# Patient Record
Sex: Male | Born: 1955 | Race: Black or African American | Hispanic: No | Marital: Single | Smoking: Former smoker
Health system: Southern US, Community
[De-identification: ages and names within clinical notes are randomized; demographics above are authoritative.]

## PROBLEM LIST (undated history)

## (undated) DIAGNOSIS — R030 Elevated blood-pressure reading, without diagnosis of hypertension: Secondary | ICD-10-CM

## (undated) DIAGNOSIS — T7840XA Allergy, unspecified, initial encounter: Secondary | ICD-10-CM

## (undated) DIAGNOSIS — E785 Hyperlipidemia, unspecified: Secondary | ICD-10-CM

## (undated) DIAGNOSIS — K219 Gastro-esophageal reflux disease without esophagitis: Secondary | ICD-10-CM

## (undated) HISTORY — DX: Elevated blood-pressure reading, without diagnosis of hypertension: R03.0

## (undated) HISTORY — DX: Gastro-esophageal reflux disease without esophagitis: K21.9

## (undated) HISTORY — PX: OTHER SURGICAL HISTORY: SHX169

## (undated) HISTORY — DX: Allergy, unspecified, initial encounter: T78.40XA

## (undated) HISTORY — PX: WISDOM TOOTH EXTRACTION: SHX21

## (undated) HISTORY — DX: Hyperlipidemia, unspecified: E78.5

## (undated) HISTORY — PX: MOUTH SURGERY: SHX715

---

## 2012-01-14 ENCOUNTER — Encounter (HOSPITAL_COMMUNITY): Payer: Self-pay

## 2012-01-14 ENCOUNTER — Emergency Department (HOSPITAL_COMMUNITY)
Admission: EM | Admit: 2012-01-14 | Discharge: 2012-01-14 | Disposition: A | Discharge status: Home / Self Care | Payer: Federal, State, Local not specified - PPO | Attending: Emergency Medicine | Admitting: Emergency Medicine

## 2012-01-14 DIAGNOSIS — F17200 Nicotine dependence, unspecified, uncomplicated: Secondary | ICD-10-CM | POA: Insufficient documentation

## 2012-01-14 DIAGNOSIS — H6090 Unspecified otitis externa, unspecified ear: Secondary | ICD-10-CM

## 2012-01-14 DIAGNOSIS — H9209 Otalgia, unspecified ear: Secondary | ICD-10-CM | POA: Insufficient documentation

## 2012-01-14 MED ORDER — NEOMYCIN-POLYMYXIN-HC 3.5-10000-1 OT SUSP: 4.0000 [drp] | Freq: Four times a day (QID) | OTIC | Status: AC

## 2012-01-14 NOTE — Discharge Instructions (Signed)
Otitis Externa  Otitis externa ("swimmer's ear") is a germ (bacterial) or fungal infection of the outer ear canal (from the eardrum to the outside of the ear). Swimming in dirty water may cause swimmer's ear. It also may be caused by moisture in the ear from water remaining after swimming or bathing. Often the first signs of infection may be itching in the ear canal. This may progress to ear canal swelling, redness, and pus drainage, which may be signs of infection.  HOME CARE INSTRUCTIONS    Apply the antibiotic drops to the ear canal as prescribed by your doctor.   This can be a very painful medical condition. A strong pain reliever may be prescribed.   Only take over-the-counter or prescription medicines for pain, discomfort, or fever as directed by your caregiver.   If your caregiver has given you a follow-up appointment, it is very important to keep that appointment. Not keeping the appointment could result in a chronic or permanent injury, pain, hearing loss and disability. If there is any problem keeping the appointment, you must call back to this facility for assistance.  PREVENTION    It is important to keep your ear dry. Use the corner of a towel to wick water out of the ear canal after swimming or bathing.   Avoid scratching in your ear. This can damage the ear canal or remove the protective wax lining the canal and make it easier for germs (bacteria) or a fungus to grow.   You may use ear drops made of rubbing alcohol and vinegar after swimming to prevent future "swimmer's ear" infections. Make up a small bottle of equal parts white vinegar and alcohol. Put 3 or 4 drops into each ear after swimming.   Avoid swimming in lakes, polluted water, or poorly chlorinated pools.  SEEK MEDICAL CARE IF:    An oral temperature above 102 F (38.9 C) develops.   Your ear is still painful after 3 days and shows signs of getting worse (redness, swelling, pain, or pus).  MAKE SURE YOU:    Understand these  instructions.   Will watch your condition.   Will get help right away if you are not doing well or get worse.  Document Released: 09/30/2005 Document Revised: 09/19/2011 Document Reviewed: 05/06/2008  [employer] Patient Information 2012 [employer].

## 2012-01-14 NOTE — ED Notes (Addendum)
LT ear pain x  1 week.  States there has been a little bit of brown drainage w/a spot of blood.  States it is hard to hear out of that ear.

## 2012-01-14 NOTE — ED Provider Notes (Signed)
History     CSN: [id number]  Arrival date & time 01/14/12  1854   First MD Initiated Contact with Patient 01/14/12 1921      No chief complaint on file.   (Consider location/radiation/quality/duration/timing/severity/associated sxs/prior treatment) HPI Comments: Patient reports that he began having left ear pain two days ago.  Prior to this he was concerned about wax in his ear and had been using OTC cerumen removal ear drops and had been flushing his ear with water to remove the cerumen.    Patient is a 56 y.o. male presenting with ear pain. The history is provided by the patient.  Otalgia This is a new problem. There is pain in the left ear. The problem occurs constantly. The problem has been gradually worsening. There has been no fever. The pain is moderate. Pertinent negatives include no ear discharge, no headaches, no hearing loss, no rhinorrhea, no sore throat, no vomiting, no neck pain and no rash.    History reviewed. No pertinent past medical history.  History reviewed. No pertinent past surgical history.  History reviewed. No pertinent family history.  History  Substance Use Topics  . Smoking status: Current Some Day Smoker -- 0.0 packs/day    Types: Pipe, Cigars  . Smokeless tobacco: Not on file  . Alcohol Use: Yes     occasionally      Review of Systems  Constitutional: Negative for fever and chills.  HENT: Positive for ear pain. Negative for hearing loss, congestion, sore throat, rhinorrhea, neck pain, sinus pressure, tinnitus and ear discharge.   Gastrointestinal: Negative for vomiting.  Skin: Negative for color change and rash.  Neurological: Negative for dizziness, light-headedness and headaches.    Allergies  Penicillins  Home Medications   Current Outpatient Rx  Name Route Sig Dispense Refill  . MENS MULTI VITAMIN & MINERAL PO Oral Take 1 tablet by mouth daily.    . NYQUIL PO Oral Take 1 capsule by mouth daily as needed. Flu like symptoms       BP 112/64  Pulse 79  Temp(Src) 98.3 F (36.8 C) (Oral)  Resp 18  SpO2 100%  Physical Exam  Nursing note and vitals reviewed. Constitutional: He appears well-developed and well-nourished.  HENT:  Head: Normocephalic.  Right Ear: Hearing, tympanic membrane, external ear and ear canal normal.  Left Ear: Hearing, tympanic membrane and external ear normal.  Nose: Nose normal. Right sinus exhibits no maxillary sinus tenderness and no frontal sinus tenderness. Left sinus exhibits no maxillary sinus tenderness and no frontal sinus tenderness.  Mouth/Throat: Uvula is midline, oropharynx is clear and moist and mucous membranes are normal.       Left EAC edema and erythema   Neck: Normal range of motion. Neck supple.  Cardiovascular: Normal rate, regular rhythm and normal heart sounds.   Pulmonary/Chest: Effort normal and breath sounds normal.  Neurological: He is alert.  Skin: Skin is warm and dry. No erythema.  Psychiatric: He has a normal mood and affect.    ED Course  Procedures (including critical care time)  Labs Reviewed - No data to display No results found.   No diagnosis found.    MDM  Patient with ear pain x 2 days.  No loss of hearing.  PE consistent with OE.  Will treat patient Polymyxin B/Neomycin/Hydrocortisone drops.        Pascal Lux [place], PA-C 01/15/12 1743

## 2012-01-15 NOTE — ED Provider Notes (Signed)
Medical screening examination/treatment/procedure(s) were performed by non-physician practitioner and as supervising physician I was immediately available for consultation/collaboration.   Jani Moronta, MD 01/15/12 2101 

## 2016-09-02 DIAGNOSIS — K080 Exfoliation of teeth due to systemic causes: Secondary | ICD-10-CM | POA: Diagnosis not present

## 2016-09-20 DIAGNOSIS — H04123 Dry eye syndrome of bilateral lacrimal glands: Secondary | ICD-10-CM | POA: Diagnosis not present

## 2016-09-20 DIAGNOSIS — H2513 Age-related nuclear cataract, bilateral: Secondary | ICD-10-CM | POA: Diagnosis not present

## 2017-01-07 ENCOUNTER — Encounter: Payer: Self-pay | Admitting: Family Medicine

## 2017-01-07 ENCOUNTER — Ambulatory Visit (INDEPENDENT_AMBULATORY_CARE_PROVIDER_SITE_OTHER): Payer: Federal, State, Local not specified - PPO | Admitting: Family Medicine

## 2017-01-07 VITALS — BP 118/88 | HR 86 | Temp 98.5°F | Ht 69.0 in | Wt 191.2 lb

## 2017-01-07 DIAGNOSIS — Z87891 Personal history of nicotine dependence: Secondary | ICD-10-CM | POA: Diagnosis not present

## 2017-01-07 DIAGNOSIS — N400 Enlarged prostate without lower urinary tract symptoms: Secondary | ICD-10-CM | POA: Insufficient documentation

## 2017-01-07 DIAGNOSIS — E785 Hyperlipidemia, unspecified: Secondary | ICD-10-CM

## 2017-01-07 DIAGNOSIS — N3943 Post-void dribbling: Secondary | ICD-10-CM

## 2017-01-07 DIAGNOSIS — Z789 Other specified health status: Secondary | ICD-10-CM | POA: Diagnosis not present

## 2017-01-07 DIAGNOSIS — R030 Elevated blood-pressure reading, without diagnosis of hypertension: Secondary | ICD-10-CM

## 2017-01-07 DIAGNOSIS — Z0000 Encounter for general adult medical examination without abnormal findings: Secondary | ICD-10-CM

## 2017-01-07 DIAGNOSIS — N401 Enlarged prostate with lower urinary tract symptoms: Secondary | ICD-10-CM

## 2017-01-07 DIAGNOSIS — Z125 Encounter for screening for malignant neoplasm of prostate: Secondary | ICD-10-CM

## 2017-01-07 LAB — LIPID PANEL
CHOL/HDL RATIO: 5
Cholesterol: 222 mg/dL — ABNORMAL HIGH (ref 0–200)
HDL: 42.2 mg/dL (ref >39.00)
LDL Cholesterol: 160 mg/dL — ABNORMAL HIGH (ref 0–99)
NONHDL: 179.87
Triglycerides: 99 mg/dL (ref 0.0–149.0)
VLDL: 19.8 mg/dL (ref 0.0–40.0)

## 2017-01-07 LAB — COMPREHENSIVE METABOLIC PANEL
ALK PHOS: 85 U/L (ref 39–117)
ALT: 21 U/L (ref 0–53)
AST: 19 U/L (ref 0–37)
Albumin: 5 g/dL (ref 3.5–5.2)
BUN: 14 mg/dL (ref 6–23)
CO2: 31 meq/L (ref 19–32)
Calcium: 9.8 mg/dL (ref 8.4–10.5)
Chloride: 99 mEq/L (ref 96–112)
Creatinine, Ser: 1.01 mg/dL (ref 0.40–1.50)
GFR: 97.36 mL/min (ref >60.00)
GLUCOSE: 91 mg/dL (ref 70–99)
Potassium: 3.9 mEq/L (ref 3.5–5.1)
SODIUM: 136 meq/L (ref 135–145)
TOTAL PROTEIN: 7.4 g/dL (ref 6.0–8.3)
Total Bilirubin: 1 mg/dL (ref 0.2–1.2)

## 2017-01-07 LAB — POC URINALSYSI DIPSTICK (AUTOMATED)
BILIRUBIN UA: NEGATIVE
GLUCOSE UA: NEGATIVE
KETONES UA: NEGATIVE
Leukocytes, UA: NEGATIVE
NITRITE UA: NEGATIVE
Protein, UA: NEGATIVE
RBC UA: NEGATIVE
SPEC GRAV UA: 1.03 (ref 1.030–1.035)
Urobilinogen, UA: 0.2 (ref ?–2.0)
pH, UA: 6 (ref 5.0–8.0)

## 2017-01-07 LAB — CBC
HCT: 45.4 % (ref 39.0–52.0)
Hemoglobin: 15.2 g/dL (ref 13.0–17.0)
MCHC: 33.5 g/dL (ref 30.0–36.0)
MCV: 92 fl (ref 78.0–100.0)
Platelets: 262 10*3/uL (ref 150.0–400.0)
RBC: 4.94 Mil/uL (ref 4.22–5.81)
RDW: 13.6 % (ref 11.5–15.5)
WBC: 3.7 10*3/uL — AB (ref 4.0–10.5)

## 2017-01-07 LAB — PSA: PSA: 1.56 ng/mL (ref 0.10–4.00)

## 2017-01-07 NOTE — Patient Instructions (Addendum)
Sign release of information at the check out desk for records from Dr. Nena Jordan including office visits, labs, imaging, immunizations for last 3 years at least. Need colonoscopy report as well  May want to keep an eye on blood pressure with goal <140/90  Prostate slightly large, will need to trend psa yearly  Please stop by lab before you go

## 2017-01-07 NOTE — Assessment & Plan Note (Signed)
Travels for [place]- may reach out for Malaria prophylaxis. He will let me know what he tolerated last trip and I will review

## 2017-01-07 NOTE — Assessment & Plan Note (Signed)
states up with stress. Initially elevated today and improved on repeat- will trend yearly

## 2017-01-07 NOTE — Assessment & Plan Note (Signed)
BPH in family members- dad, brother. He has enlarged prostate on exam and deals with dribbling at times.  Lab Results  Component Value Date   PSA 1.56 01/07/2017  we discussed trending psa and getting rectal exam yearly

## 2017-01-07 NOTE — Progress Notes (Signed)
Pre visit review using our clinic review tool, if applicable. No additional management support is needed unless otherwise documented below in the visit note. 

## 2017-01-07 NOTE — Assessment & Plan Note (Signed)
no rx. 7.1% 10 year ascvd risk. Repeat levels next year. Work on Oceanographer exercise

## 2017-01-07 NOTE — Progress Notes (Signed)
Phone: [phone]  Subjective:  Patient presents today to establish care.  Prior patient in [place] Dr. Nena Jordan just moved within last year. Chief complaint-noted.   See problem oriented charting  The following were reviewed and entered/updated in epic: Past Medical History:  Diagnosis Date  . Elevated blood pressure reading    states up with stress.   . Hyperlipidemia    no rx   Patient Active Problem List   Diagnosis Date Noted  . Hyperlipidemia     Priority: High  . BPH (benign prostatic hyperplasia) 01/07/2017    Priority: Medium  . Former smoker 01/07/2017    Priority: Low  . Hx of foreign travel 01/07/2017    Priority: Low  . Elevated blood pressure reading     Priority: Low   Past Surgical History:  Procedure Laterality Date  . none      Family History  Problem Relation Age of Onset  . Hypertension Mother   . Pneumonia Mother     lived to 4  . Hypertension Father   . Prostate cancer Father     lived to 75  . Asthma Sister     due to asthma  . Prostate cancer Brother     he is a gynecologist in [place]    Medications- reviewed and updated Current Outpatient Prescriptions  Medication Sig Dispense Refill  . Multiple Vitamins-Minerals (MENS MULTI VITAMIN & MINERAL PO) Take 1 tablet by mouth daily.     No current facility-administered medications for this visit.     Allergies-reviewed and updated Allergies  Allergen Reactions  . Penicillins     unknown    Social History   Social History  . Marital status: Single    Spouse name: N/A  . Number of children: N/A  . Years of education: N/A   Social History Main Topics  . Smoking status: Former Smoker    Packs/day: 0.40    Years: 20.00    Types: Pipe, Cigars    Quit date: 10/14/2008  . Smokeless tobacco: Never Used  . Alcohol use Yes     Comment: occasionally  . Drug use: No  . Sexual activity: Yes   Other Topics Concern  . None   Social History Narrative   Lives alone. Single. Dating  long distance. Has more than 1 partner but uses protection   From [place]. Has been to 75 countries.       Retired around 6. 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  HENT: Negative for hearing loss and tinnitus.   Eyes: Negative for blurred vision and double vision.  Respiratory: Negative for cough and hemoptysis.   Cardiovascular: Negative for chest pain and claudication.  Gastrointestinal: Negative for heartburn and nausea.  Genitourinary: Positive for frequency. Negative for urgency.  Musculoskeletal: Negative for myalgias and neck pain.  Skin: Negative for itching and rash.  Neurological: Negative for dizziness and headaches.  Endo/Heme/Allergies: Negative for polydipsia. Does not bruise/bleed easily.  Psychiatric/Behavioral: Negative for hallucinations and substance abuse.  does have low libido at times but no major issue with erections Palpitations in past but none recently  Objective: BP 118/88   Pulse 86  Temp 98.5 F (36.9 C) (Oral)   Ht 5\' 9"  (1.753 m)   Wt 191 lb 3.2 oz (86.7 kg)   SpO2 98%   BMI 28.24 kg/m  Gen: NAD, resting comfortably HEENT: Mucous membranes are moist. Oropharynx normal. TM normal. Eyes: sclera and lids normal, PERRLA Neck: no thyromegaly, no cervical lymphadenopathy CV: RRR no murmurs rubs or gallops Lungs: CTAB no crackles, wheeze, rhonchi Abdomen: soft/nontender/nondistended/normal bowel sounds. No rebound or guarding.  Ext: no edema Skin: warm, dry Neuro: 5/5 strength in upper and lower extremities, normal gait, normal reflexes Rectal: normal tone, mild diffusely enlarged prostate, no masses or tenderness  Assessment/Plan:  61  y.o. male presenting for annual physical.  Health Maintenance counseling: 1. Anticipatory guidance: Patient counseled regarding regular dental exams, eye exams, wearing seatbelts.  2. Risk factor reduction:  Advised patient of need for regular exercise and diet rich and fruits and vegetables to reduce risk of heart attack and stroke. Exercises at least 3x a week.  3. Immunizations/screenings/ancillary studies Immunization History  Administered Date(s) Administered  . Influenza-Unspecified 08/28/2016   Health Maintenance Due  Topic Date Due  . Hepatitis C Screening - states had this years ago thorugh work 1956-09-03  . HIV Screening - did within last year 05/18/1973  . TETANUS/TDAP - had when went to [place]. Will get records.  05/18/1977  . COLONOSCOPY - get records around 2013 or 2014 05/18/2008   4. Prostate cancer screening- BPH on exam and history. Will trend PSA Lab Results  Component Value Date   PSA 1.56 01/07/2017  5. Colon cancer screening - as noted above 6. STD screening- declines, despite having new partner in last year. Always uses protection   Hx of foreign travel [place] for [place]- may reach out for Malaria prophylaxis. He will let me know what he tolerated last trip and I will review  BPH (benign prostatic hyperplasia) BPH in family members- dad, brother. He has enlarged prostate on exam and deals with dribbling at times.  Lab Results  Component Value Date   PSA 1.56 01/07/2017  we discussed trending psa and getting rectal exam yearly  Hyperlipidemia no rx. 7.1% 10 year ascvd risk. Repeat levels next year. Work on healthy eating/regular exercise  Elevated blood pressure reading states up with stress. Initially elevated today and improved on repeat- will trend yearly   Return in about 1 year (around 01/07/2018) for physical.   Orders Placed This Encounter  Procedures  . CBC    [place]  . Comprehensive metabolic panel    [place]    Order Specific Question:    Has the patient fasted?    Answer:   No  . Lipid panel    [place]    Order Specific Question:   Has the patient fasted?    Answer:   No  . PSA  . POCT Urinalysis Dipstick (Automated)   Return precautions advised. Garret Reddish, MD

## 2017-01-08 ENCOUNTER — Encounter: Payer: Self-pay | Admitting: Family Medicine

## 2017-01-08 MED ORDER — MEFLOQUINE HCL 250 MG PO TABS: 250.0000 mg | ORAL_TABLET | ORAL | 0 refills | Status: DC

## 2017-01-16 ENCOUNTER — Encounter: Payer: Federal, State, Local not specified - PPO | Admitting: Family Medicine

## 2017-01-23 ENCOUNTER — Telehealth: Payer: Self-pay

## 2017-01-23 NOTE — Telephone Encounter (Signed)
I called and left a detailed voicemail message on his voicemail reading what the My Chart message stated. I also left a return phone number for any questions.

## 2017-01-23 NOTE — Telephone Encounter (Signed)
-----   Message from Marin Olp, MD sent at 01/22/2017  9:21 AM EDT ----- Can convert to phone call. Unread mychart message  "I sent in mefloquine for you. You should start it 1 week before you travel and continue 4 weeks after you return.   My recommendation for cholesterol would be first to increase exercise to 4-5 days a week. In addition, the [place] diet has been shown to be heart healthy diet and it would be reasonable to focus on a diet rich in vegetables and fruits.   Garret Reddish  "

## 2017-02-03 ENCOUNTER — Encounter: Payer: Self-pay | Admitting: Family Medicine

## 2017-02-03 ENCOUNTER — Telehealth: Payer: Self-pay | Admitting: Family Medicine

## 2017-02-03 MED ORDER — PREDNISONE 20 MG PO TABS: ORAL_TABLET | ORAL | 0 refills | Status: DC

## 2017-02-03 MED ORDER — FLUTICASONE PROPIONATE 50 MCG/ACT NA SUSP: 2.0000 | Freq: Every day | NASAL | 6 refills | Status: DC

## 2017-02-03 NOTE — Telephone Encounter (Signed)
Letter proposal.

## 2017-02-03 NOTE — Telephone Encounter (Signed)
See my chart message

## 2017-02-03 NOTE — Telephone Encounter (Signed)
Troy Christian pt is returning your call it is personal state "I really need to speak with Dr. Yong Channel on a urgent matter"

## 2017-02-03 NOTE — Telephone Encounter (Signed)
Dr. Hunter - Please advise. Thanks! 

## 2017-02-03 NOTE — Telephone Encounter (Signed)
Called and spoke with patient this morning who states that his allergies have flared and he is having a hard time getting them under control. He states he sent a letter via My Chart (a draft) that he would like Dr. Yong Channel to complete and sign so that he can reschedule his flight as he feels he will not be able to fly on Thursday. He is supposed to be traveling to [place], [place].

## 2017-02-05 NOTE — Telephone Encounter (Signed)
Pt scheduled for 02/06/17

## 2017-02-06 ENCOUNTER — Encounter: Payer: Self-pay | Admitting: Family Medicine

## 2017-02-06 ENCOUNTER — Ambulatory Visit (INDEPENDENT_AMBULATORY_CARE_PROVIDER_SITE_OTHER): Payer: Federal, State, Local not specified - PPO | Admitting: Family Medicine

## 2017-02-06 VITALS — BP 104/70 | HR 73 | Temp 98.6°F | Ht 69.0 in | Wt 189.2 lb

## 2017-02-06 DIAGNOSIS — J301 Allergic rhinitis due to pollen: Secondary | ICD-10-CM | POA: Diagnosis not present

## 2017-02-06 NOTE — Patient Instructions (Signed)
Glad you are improving.   Please complete prednisone. Would continue flonase and loratadine for next 2 months at least.   If you are not continuing to improve 2 weeks from now, lets add sinulair 10mg .   If no better on that, would refer you to allergist  Letter printed and also sent to you by [facility]

## 2017-02-06 NOTE — Progress Notes (Signed)
Pre visit review using our clinic review tool, if applicable. No additional management support is needed unless otherwise documented below in the visit note. 

## 2017-02-06 NOTE — Progress Notes (Signed)
Subjective:  Troy Christian is a 61 y.o. year old very pleasant male patient who presents for/with See problem oriented charting ROS- no fever, chills. Having cough, congestion, sneezing, itchy eyes   Past Medical History-  Patient Active Problem List   Diagnosis Date Noted  . Hyperlipidemia     Priority: High  . BPH (benign prostatic hyperplasia) 01/07/2017    Priority: Medium  . Former smoker 01/07/2017    Priority: Low  . Hx of foreign travel 01/07/2017    Priority: Low  . Elevated blood pressure reading     Priority: Low    Medications- reviewed and updated Current Outpatient Prescriptions  Medication Sig Dispense Refill  . fluticasone (FLONASE) 50 MCG/ACT nasal spray Place 2 sprays into both nostrils daily. 16 g 6  . mefloquine (LARIAM) 250 MG tablet Take 1 tablet (250 mg total) by mouth every 7 (seven) days. 30 tablet 0  . Multiple Vitamins-Minerals (MENS MULTI VITAMIN & MINERAL PO) Take 1 tablet by mouth daily.     No current facility-administered medications for this visit.     Objective: BP 104/70 (BP Location: Left Arm, Patient Position: Sitting, Cuff Size: Large)   Pulse 73   Temp 98.6 F (37 C) (Oral)   Ht 5\' 9"  (1.753 m)   Wt 189 lb 3.2 oz (85.8 kg)   SpO2 97%   BMI 27.94 kg/m  Gen: NAD, resting comfortably Clear nasal congestion, rubs eyes, cobblestoning of pharynx CV: RRR no murmurs rubs or gallops Lungs: CTAB no crackles, wheeze, rhonchi Ext: no edema Skin: warm, dry  Assessment/Plan:  Seasonal allergic rhinitis due to pollen S: Patient reached out to [place] about 3 days ago about his allergies being severe and preventing flight ot [place]. I sent in flonase for him to take along with loratadine he was already taking. Also sent in prednisone.   He states he is about 50-75% better but as soon as he goes outside he gets into fits of sneezing and congestion worsens. Constantly wants to rub his eyes. Feels very fatigued.   Planned flight today to [place]- [place].  He would like to reschedule the flight. Last time he flew to [place] when allergies flared- he had a more severe flare.  A/P: continue claritin, flonase, prednisone. Severe allergy flare but improving. Given his prior history of severe flare with travel, I do think very reasonable to reschedule his flight. I wrote a letter for him to give to the airline. I also do not know that he could tolerate the plane flight due to pressure in sinuses with flared allergies.   In 2 weeks if not continuing to improve add singulair. If no better after 2 weeks of that- would refer to allergist  Return precautions advised.  Garret Reddish, MD

## 2017-03-03 ENCOUNTER — Encounter: Payer: Self-pay | Admitting: Family Medicine

## 2017-03-03 ENCOUNTER — Ambulatory Visit (INDEPENDENT_AMBULATORY_CARE_PROVIDER_SITE_OTHER): Payer: Federal, State, Local not specified - PPO | Admitting: Family Medicine

## 2017-03-03 VITALS — BP 118/72 | HR 74 | Temp 98.4°F | Ht 69.0 in | Wt 191.0 lb

## 2017-03-03 DIAGNOSIS — M545 Low back pain, unspecified: Secondary | ICD-10-CM

## 2017-03-03 DIAGNOSIS — N50819 Testicular pain, unspecified: Secondary | ICD-10-CM | POA: Diagnosis not present

## 2017-03-03 DIAGNOSIS — G8929 Other chronic pain: Secondary | ICD-10-CM

## 2017-03-03 LAB — CBC WITH DIFFERENTIAL/PLATELET
BASOS PCT: 1.2 % (ref 0.0–3.0)
Basophils Absolute: 0 10*3/uL (ref 0.0–0.1)
EOS ABS: 0.2 10*3/uL (ref 0.0–0.7)
Eosinophils Relative: 6 % — ABNORMAL HIGH (ref 0.0–5.0)
HEMATOCRIT: 45 % (ref 39.0–52.0)
Hemoglobin: 15 g/dL (ref 13.0–17.0)
LYMPHS PCT: 39.3 % (ref 12.0–46.0)
Lymphs Abs: 1.4 10*3/uL (ref 0.7–4.0)
MCHC: 33.4 g/dL (ref 30.0–36.0)
MCV: 92.9 fl (ref 78.0–100.0)
MONOS PCT: 8.5 % (ref 3.0–12.0)
Monocytes Absolute: 0.3 10*3/uL (ref 0.1–1.0)
Neutro Abs: 1.5 10*3/uL (ref 1.4–7.7)
Neutrophils Relative %: 45 % (ref 43.0–77.0)
Platelets: 256 10*3/uL (ref 150.0–400.0)
RBC: 4.85 Mil/uL (ref 4.22–5.81)
RDW: 13.7 % (ref 11.5–15.5)
WBC: 3.4 10*3/uL — AB (ref 4.0–10.5)

## 2017-03-03 LAB — BASIC METABOLIC PANEL
BUN: 10 mg/dL (ref 6–23)
CO2: 29 mEq/L (ref 19–32)
Calcium: 9.5 mg/dL (ref 8.4–10.5)
Chloride: 104 mEq/L (ref 96–112)
Creatinine, Ser: 1.11 mg/dL (ref 0.40–1.50)
GFR: 87.26 mL/min (ref >60.00)
GLUCOSE: 90 mg/dL (ref 70–99)
POTASSIUM: 4.1 meq/L (ref 3.5–5.1)
Sodium: 138 mEq/L (ref 135–145)

## 2017-03-03 LAB — POC URINALSYSI DIPSTICK (AUTOMATED)
BILIRUBIN UA: NEGATIVE
Blood, UA: NEGATIVE
Glucose, UA: NEGATIVE
KETONES UA: NEGATIVE
LEUKOCYTES UA: NEGATIVE
Nitrite, UA: NEGATIVE
PH UA: 6 (ref 5.0–8.0)
Protein, UA: NEGATIVE
Spec Grav, UA: 1.025 (ref 1.010–1.025)
Urobilinogen, UA: 0.2 E.U./dL

## 2017-03-03 NOTE — Progress Notes (Signed)
Subjective:  Troy Christian is a 61 y.o. year old very pleasant male patient who presents for/with See problem oriented charting ROS- no fever, chills, nausea, vomiting. No change in appetite. No dyuria, polyuria, penile discharge   Past Medical History-  Patient Active Problem List   Diagnosis Date Noted  . Hyperlipidemia     Priority: High  . BPH (benign prostatic hyperplasia) 01/07/2017    Priority: Medium  . Former smoker 01/07/2017    Priority: Low  . Hx of foreign travel 01/07/2017    Priority: Low  . Elevated blood pressure reading     Priority: Low    Medications- reviewed and updated Current Outpatient Prescriptions  Medication Sig Dispense Refill  . fluticasone (FLONASE) 50 MCG/ACT nasal spray Place 2 sprays into both nostrils daily. 16 g 6  . mefloquine (LARIAM) 250 MG tablet Take 1 tablet (250 mg total) by mouth every 7 (seven) days. 30 tablet 0  . Multiple Vitamins-Minerals (MENS MULTI VITAMIN & MINERAL PO) Take 1 tablet by mouth daily.     No current facility-administered medications for this visit.     Objective: BP 118/72 (BP Location: Left Arm, Patient Position: Sitting, Cuff Size: Large)   Pulse 74   Temp 98.4 F (36.9 C) (Oral)   Ht 5\' 9"  (1.753 m)   Wt 191 lb (86.6 kg)   SpO2 96%   BMI 28.21 kg/m  Gen: NAD, resting comfortably CV: RRR no murmurs rubs or gallops Lungs: CTAB no crackles, wheeze, rhonchi Abdomen: soft/nontender/nondistended/normal bowel sounds. No rebound or guarding.  Ext: no edema Skin: warm, dry, no rash over pelvic region Rectal: normal tone, diffusely enlarged prostate, no masses or tenderness GU: no hernias noted, no scrotal abnormalities.   Assessment/Plan:  Chronic left-sided low back pain without sciatica -  Acute Testicular discomfort - Plan: CBC with Differential/Platelet, Basic metabolic panel, POCT Urinalysis Dipstick (Automated) S: Patient since Saturday has noted feeling of fullness in bilateral testicles. Rates pain  2-3/10. Seems to be slightly more noticeable with bending over. No dysuria, polyuria, rectal pain. Has been doing overhead lifting, having fair amount of sex, and doing a lot of jumping up and down in church and wonders about those as triggers. He feels like it is a "deep Pain" and also worrires about a hernia. No treatments tried  A/P: unclear cause of pain- though suspect musculoskeletal from heavy lifts overhead. nontender prostate on exam doubt prostatitis. UA negative so doubt UTI. No obvious hernia or scrotal abnormalities. Nontoxic appearing- doubt appendicitis and pain not reproducible. Discussed doing testicular ultrasound given discomfort- he declines for now but agrees to this if with avoiding more strenuous activities he had continued pain next month.   1 week follow up to let us know if has not had significant improvement. See avs as well  Orders Placed This Encounter  Procedures  . CBC with Differential/Platelet    Standing Status:   Future    Number of Occurrences:   1    Standing Expiration Date:   03/03/2018  . Basic metabolic panel    Standing Status:   Future    Number of Occurrences:   1    Standing Expiration Date:   03/03/2018  . POCT Urinalysis Dipstick (Automated)    Standing Status:   Future    Number of Occurrences:   1    Standing Expiration Date:   03/03/2018   Return precautions advised.  Garret Reddish, MD

## 2017-03-03 NOTE — Patient Instructions (Addendum)
Schedule lab visit for tomorrow morning at check out desk.   No lifting, sex, jumping up and down over the next week.   If symptoms not improving- get ultrasound of testicles  Let me know immediately if worsening pain, fever, other new symptoms  Sign release of information at the check out desk for records from Dr. Nena Jordan including office visits, labs, imaging, immunizations for last 3 years at least. Need colonoscopy report as well

## 2017-03-04 ENCOUNTER — Other Ambulatory Visit (INDEPENDENT_AMBULATORY_CARE_PROVIDER_SITE_OTHER): Payer: Federal, State, Local not specified - PPO

## 2017-03-04 DIAGNOSIS — G8929 Other chronic pain: Secondary | ICD-10-CM

## 2017-03-04 DIAGNOSIS — N50819 Testicular pain, unspecified: Secondary | ICD-10-CM

## 2017-03-04 DIAGNOSIS — M545 Low back pain: Secondary | ICD-10-CM

## 2017-09-11 ENCOUNTER — Other Ambulatory Visit: Payer: Self-pay

## 2017-09-11 ENCOUNTER — Emergency Department (HOSPITAL_COMMUNITY)
Admission: EM | Admit: 2017-09-11 | Discharge: 2017-09-12 | Disposition: A | Discharge status: Home / Self Care | Payer: Federal, State, Local not specified - PPO | Attending: Emergency Medicine | Admitting: Emergency Medicine

## 2017-09-11 ENCOUNTER — Encounter (HOSPITAL_COMMUNITY): Payer: Self-pay | Admitting: Emergency Medicine

## 2017-09-11 DIAGNOSIS — S0990XA Unspecified injury of head, initial encounter: Secondary | ICD-10-CM | POA: Diagnosis not present

## 2017-09-11 DIAGNOSIS — S298XXA Other specified injuries of thorax, initial encounter: Secondary | ICD-10-CM | POA: Diagnosis not present

## 2017-09-11 DIAGNOSIS — S060X1A Concussion with loss of consciousness of 30 minutes or less, initial encounter: Secondary | ICD-10-CM | POA: Diagnosis not present

## 2017-09-11 DIAGNOSIS — S161XXA Strain of muscle, fascia and tendon at neck level, initial encounter: Secondary | ICD-10-CM | POA: Diagnosis not present

## 2017-09-11 DIAGNOSIS — Y999 Unspecified external cause status: Secondary | ICD-10-CM | POA: Diagnosis not present

## 2017-09-11 DIAGNOSIS — S39012A Strain of muscle, fascia and tendon of lower back, initial encounter: Secondary | ICD-10-CM | POA: Diagnosis not present

## 2017-09-11 DIAGNOSIS — Z79899 Other long term (current) drug therapy: Secondary | ICD-10-CM | POA: Diagnosis not present

## 2017-09-11 DIAGNOSIS — Y929 Unspecified place or not applicable: Secondary | ICD-10-CM | POA: Insufficient documentation

## 2017-09-11 DIAGNOSIS — Y939 Activity, unspecified: Secondary | ICD-10-CM | POA: Diagnosis not present

## 2017-09-11 DIAGNOSIS — Z87891 Personal history of nicotine dependence: Secondary | ICD-10-CM | POA: Insufficient documentation

## 2017-09-11 DIAGNOSIS — S299XXA Unspecified injury of thorax, initial encounter: Secondary | ICD-10-CM | POA: Diagnosis not present
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
## 2017-09-12 ENCOUNTER — Emergency Department (HOSPITAL_COMMUNITY): Payer: Federal, State, Local not specified - PPO

## 2017-09-12 MED ORDER — IBUPROFEN 800 MG PO TABS
800.0000 mg | ORAL_TABLET | Freq: Once | ORAL | Status: AC
  Administered 2017-09-12: 800 mg via ORAL
  Filled 2017-09-12: qty 1

## 2017-09-12 MED ORDER — IBUPROFEN 600 MG PO TABS: 600.0000 mg | ORAL_TABLET | Freq: Four times a day (QID) | ORAL | 0 refills | Status: DC | PRN

## 2017-09-12 MED ORDER — HYDROCODONE-ACETAMINOPHEN 5-325 MG PO TABS: 1.0000 | ORAL_TABLET | ORAL | 0 refills | Status: DC | PRN
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
## 2017-09-12 NOTE — Discharge Instructions (Signed)

## 2018-01-20 ENCOUNTER — Encounter: Payer: Self-pay | Admitting: Family Medicine

## 2018-01-20 ENCOUNTER — Ambulatory Visit: Payer: Federal, State, Local not specified - PPO | Admitting: Family Medicine

## 2018-01-20 VITALS — BP 118/86 | HR 66 | Temp 98.2°F | Ht 69.0 in | Wt 186.6 lb

## 2018-01-20 DIAGNOSIS — M545 Low back pain: Secondary | ICD-10-CM

## 2018-01-20 DIAGNOSIS — G8929 Other chronic pain: Secondary | ICD-10-CM | POA: Diagnosis not present

## 2018-01-20 NOTE — Patient Instructions (Signed)
We will call you within a week or two about your referral to orthopedics. If you do not hear within 3 weeks, give [place] a call.   See letter
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
## 2018-01-22 ENCOUNTER — Encounter (INDEPENDENT_AMBULATORY_CARE_PROVIDER_SITE_OTHER): Payer: Self-pay | Admitting: Surgery

## 2018-01-22 ENCOUNTER — Ambulatory Visit (INDEPENDENT_AMBULATORY_CARE_PROVIDER_SITE_OTHER): Payer: Federal, State, Local not specified - PPO | Admitting: Surgery

## 2018-01-22 VITALS — Ht 69.0 in | Wt 186.0 lb

## 2018-01-22 DIAGNOSIS — M5416 Radiculopathy, lumbar region: Secondary | ICD-10-CM

## 2018-01-22 MED ORDER — METHYLPREDNISOLONE 4 MG PO TABS: ORAL_TABLET | ORAL | 0 refills | Status: DC
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
## 2018-02-06 ENCOUNTER — Ambulatory Visit
Admission: RE | Admit: 2018-02-06 | Discharge: 2018-02-06 | Disposition: A | Discharge status: Home / Self Care | Payer: Federal, State, Local not specified - PPO | Source: Ambulatory Visit | Attending: Surgery | Admitting: Surgery

## 2018-02-06 DIAGNOSIS — M5416 Radiculopathy, lumbar region: Secondary | ICD-10-CM

## 2018-02-13 ENCOUNTER — Ambulatory Visit (INDEPENDENT_AMBULATORY_CARE_PROVIDER_SITE_OTHER): Payer: Federal, State, Local not specified - PPO | Admitting: Specialist

## 2018-02-13 ENCOUNTER — Encounter (INDEPENDENT_AMBULATORY_CARE_PROVIDER_SITE_OTHER): Payer: Self-pay | Admitting: Specialist

## 2018-02-13 VITALS — BP 136/80 | HR 72 | Temp 98.7°F | Ht 69.0 in | Wt 185.0 lb

## 2018-02-13 DIAGNOSIS — M48062 Spinal stenosis, lumbar region with neurogenic claudication: Secondary | ICD-10-CM | POA: Diagnosis not present

## 2018-02-13 DIAGNOSIS — M5116 Intervertebral disc disorders with radiculopathy, lumbar region: Secondary | ICD-10-CM | POA: Diagnosis not present

## 2018-02-13 DIAGNOSIS — M5136 Other intervertebral disc degeneration, lumbar region: Secondary | ICD-10-CM

## 2018-02-13 MED ORDER — TRAMADOL-ACETAMINOPHEN 37.5-325 MG PO TABS: 1.0000 | ORAL_TABLET | Freq: Four times a day (QID) | ORAL | 0 refills | Status: DC | PRN

## 2018-02-13 NOTE — Patient Instructions (Addendum)
Avoid bending, stooping and avoid lifting weights greater than 10 lbs. Avoid prolong standing and walking. Avoid frequent bending and stooping  No lifting greater than 10 lbs. May use ice or moist heat for pain. Weight loss is of benefit. Handicap license is approved.  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 He has exhausted conservative means of treatement of the his condition and I recommend that he consider surgical decompressive laminectomy with microdiscectomy.  He report that he is the only parent at home now and will need to discuss options with his family before decision is made and time frame is able to be decided.  Iwill continue him on medications for pain and discomfort and schedule a return  Appointment in 4 weeks. Other forms of management including ESIs discussed with him and he does not wish to consider injections.
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
## 2018-02-18 ENCOUNTER — Telehealth: Payer: Self-pay | Admitting: Family Medicine

## 2018-02-18 NOTE — Telephone Encounter (Signed)
See note.  Copied from [place]. Topic: General - Other >> Feb 18, 2018  7:48 AM Synthia Innocent wrote: Reason for CRM: Requesting extended work note till patient is seen on 02/27/18 by Dr Yong Channel. Please advise

## 2018-02-19 NOTE — Telephone Encounter (Signed)
I thought orthopedics was writing his notes? I am ok with [place] writing one if they are not.

## 2018-02-25 NOTE — Telephone Encounter (Signed)
Letter printed. I placed copy at the front desk. I called patietn and left him a voicemail message letting him know.

## 2018-02-27 ENCOUNTER — Encounter: Payer: Self-pay | Admitting: Family Medicine

## 2018-02-27 ENCOUNTER — Ambulatory Visit: Payer: Federal, State, Local not specified - PPO | Admitting: Family Medicine

## 2018-02-27 VITALS — BP 128/76 | HR 68 | Temp 98.3°F | Ht 69.0 in | Wt 184.6 lb

## 2018-02-27 DIAGNOSIS — M48062 Spinal stenosis, lumbar region with neurogenic claudication: Secondary | ICD-10-CM | POA: Diagnosis not present

## 2018-02-27 DIAGNOSIS — M5116 Intervertebral disc disorders with radiculopathy, lumbar region: Secondary | ICD-10-CM

## 2018-02-27 NOTE — Progress Notes (Signed)
Subjective:  Troy Christian is a 62 y.o. year old very pleasant male patient who presents for/with See problem oriented charting ROS- continued back pain. Pain in legs is present but improved. No fecal or urinary incontinence reported.    Past Medical History-  Patient Active Problem List   Diagnosis Date Noted  . Hyperlipidemia     Priority: High  . Spinal stenosis of lumbar region with neurogenic claudication 02/27/2018    Priority: Medium  . BPH (benign prostatic hyperplasia) 01/07/2017    Priority: Medium  . Former smoker 01/07/2017    Priority: Low  . Hx of foreign travel 01/07/2017    Priority: Low  . Elevated blood pressure reading     Priority: Low    Medications- reviewed and updated Current Outpatient Medications  Medication Sig Dispense Refill  . amitriptyline (ELAVIL) 10 MG tablet Take 20 mg by mouth at bedtime.    . fluticasone (FLONASE) 50 MCG/ACT nasal spray Place 2 sprays into both nostrils daily. 16 g 6  . Multiple Vitamins-Minerals (MENS MULTI VITAMIN & MINERAL PO) Take 1 tablet by mouth daily.    . naproxen (NAPROSYN) 500 MG tablet Take 500 mg by mouth 2 (two) times daily with a meal.    . traMADol-acetaminophen (ULTRACET) 37.5-325 MG tablet Take 1 tablet by mouth every 6 (six) hours as needed. 30 tablet 0   No current facility-administered medications for this visit.     Objective: BP 128/76 (BP Location: Left Arm, Patient Position: Sitting, Cuff Size: Large)   Pulse 68   Temp 98.3 F (36.8 C) (Oral)   Ht 5\' 9"  (1.753 m)   Wt 184 lb 9.6 oz (83.7 kg)   SpO2 96%   BMI 27.26 kg/m  Gen: NAD, resting comfortably  Mr Lumbar Spine W/o Contrast  Result Date: 02/06/2018 CLINICAL DATA:  LEFT-sided lower back pain, pain increasing when sitting or laying down. EXAM: MRI LUMBAR SPINE WITHOUT CONTRAST TECHNIQUE: Multiplanar, multisequence MR imaging of the lumbar spine was performed. No intravenous contrast was administered. COMPARISON:  Plain films 09/12/2017.  FINDINGS: Segmentation:  Standard. Alignment:  Anatomic. Vertebrae:  No worrisome osseous lesion. Conus medullaris and cauda equina: Conus extends to the T12-L1 level. Conus and cauda equina appear normal. Paraspinal and other soft tissues: Unremarkable. Disc levels: L1-L2:  Normal. L2-L3: Central protrusion. Facet arthropathy. Borderline stenosis. No definite impingement. L3-L4: Central extrusion. Posterior element hypertrophy. Congenital short pedicles. Moderate stenosis. Subarticular zone and foraminal zone narrowing could affect the L3 and L4 nerve roots. L4-L5: Central protrusion. Posterior element hypertrophy. Congenital short pedicles. Moderate to severe stenosis. Subarticular zone and foraminal zone narrowing could affect the L4 and L5 nerve roots. L5-S1: Unremarkable disc space. Congenital short pedicles. Facet arthropathy. No definite subarticular zone or foraminal zone narrowing. IMPRESSION: No occult compression fracture or worrisome osseous lesion. 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 Short pedicles, disc protrusion/extrusion, and posterior element hypertrophy contribute to subarticular zone and foraminal zone narrowing potentially affecting the nerve roots at both levels. Electronically Signed   By: Staci Righter M.D.   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 Louanne Skye and he was advised to proceed forward with surgery. Per Dr. Otho Ket notes - patient was hesitant about injections. Patient states he went home and talked with his wife and he has become more anxious about potential surgical complications.   He has continued  pain in back and into legs but slightly improved on amitriptyline. He wanted my opinion on injections vs. surgery A/P: I told patient I respect Dr. Otho Ket judgement and since he has recommended surgery-  that is a very reasonable path to proceed down. Looking at note though- patient had declined injections and I told him he should at least consider getting an earlier appointment with ortho to consider injections. He would like to wait until June and see if his pain improves between now and then.   Patient also wanted a new letter basically confirming from my perspective support of Dr. Otho Ket decision which I was happy to write for him. We shredded his old letter about returning to work on upcoming Monday due to continued pain  Future Appointments  Date Time Provider [place]  04/10/2018 10:30 AM Jessy Oto, MD [facility] None   Time Stamp The duration of face-to-face time during this visit was greater than 15 minutes. Greater than 50% of this time was spent in counseling, explanation of diagnosis, planning of further management, and/or coordination of care including discussing patients options and my support of Dr. Otho Ket decision making.   Return precautions advised.  Garret Reddish, MD

## 2018-02-27 NOTE — Assessment & Plan Note (Signed)
Herniation of lumbar intervertebral disc with radiculopathy S: Patient has seen Dr. Louanne Skye and he was advised to proceed forward with surgery. Per Dr. Otho Ket notes - patient was hesitant about injections. Patient states he went home and talked with his wife and he has become more anxious about potential surgical complications.   He has continued pain in back and into legs but slightly improved on amitriptyline. He wanted my opinion on injections vs. surgery A/P: I told patient I respect Dr. Otho Ket judgement and since he has recommended surgery- that is a very reasonable path to proceed down. Looking at note though- patient had declined injections and I told him he should at least consider getting an earlier appointment with ortho to consider injections. He would like to wait until June and see if his pain improves between now and then.   Patient also wanted a new letter basically confirming from my perspective support of Dr. Otho Ket decision which I was happy to write for him. We shredded his old letter about returning to work on upcoming Monday due to continued pain

## 2018-02-27 NOTE — Patient Instructions (Signed)
Since you want to hold off on surgery for now- I would encourage you to discuss options of injections with Dr. Louanne Skye  Happy to see you a few weeks after your visit with Dr. Louanne Skye

## 2018-04-10 ENCOUNTER — Encounter (INDEPENDENT_AMBULATORY_CARE_PROVIDER_SITE_OTHER): Payer: Self-pay | Admitting: Specialist

## 2018-04-10 ENCOUNTER — Ambulatory Visit (INDEPENDENT_AMBULATORY_CARE_PROVIDER_SITE_OTHER): Payer: Federal, State, Local not specified - PPO | Admitting: Specialist

## 2018-04-10 VITALS — BP 135/84 | HR 62 | Ht 69.0 in | Wt 184.0 lb

## 2018-04-10 DIAGNOSIS — M48062 Spinal stenosis, lumbar region with neurogenic claudication: Secondary | ICD-10-CM | POA: Diagnosis not present

## 2018-04-10 DIAGNOSIS — M5126 Other intervertebral disc displacement, lumbar region: Secondary | ICD-10-CM

## 2018-04-10 MED ORDER — TRAMADOL HCL (ER BIPHASIC) 200 MG PO TB24: 1.0000 | ORAL_TABLET | Freq: Two times a day (BID) | ORAL | 0 refills | Status: AC

## 2018-04-10 NOTE — Addendum Note (Signed)
Addended by: Minda Ditto, Alyse Low N on: 04/10/2018 01:22 PM   Modules accepted: Orders

## 2018-04-10 NOTE — Patient Instructions (Addendum)
Avoid frequent bending and stooping  No lifting greater than 10 lbs. May use ice or moist heat for pain. Weight loss is of benefit. We will try tramadol sustain release form 200 mg 2 times daily.
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
## 2018-04-22 ENCOUNTER — Telehealth (INDEPENDENT_AMBULATORY_CARE_PROVIDER_SITE_OTHER): Payer: Self-pay

## 2018-04-22 NOTE — Telephone Encounter (Signed)
Patient left a message wanting an estimate letter for surgery and post treatment to give to his insurance company.  What is the procedure/code?  What post op treatment is needed?  P.T.?

## 2018-05-20 ENCOUNTER — Other Ambulatory Visit (INDEPENDENT_AMBULATORY_CARE_PROVIDER_SITE_OTHER): Payer: Self-pay | Admitting: Specialist

## 2018-05-20 NOTE — Telephone Encounter (Signed)
ultracet refill request, Can you please advise?

## 2018-05-20 NOTE — Telephone Encounter (Signed)
Called to [facility] 

## 2018-09-29 ENCOUNTER — Telehealth: Payer: Self-pay | Admitting: Family Medicine

## 2018-09-29 NOTE — Telephone Encounter (Signed)
See note  Copied from [place] [id number]. Topic: General - Other >> Sep 29, 2018  4:27 PM Mcneil, Ja-Kwan wrote: Reason for CRM: Pt stated he is in the [place] at the moment but he really needs to speak directly with Dr. Yong Channel. Pt stated he is roaming but he really needs Dr. Yong Channel to call him back at [phone]. Pt also stated that he needs Dr. Yong Channel to contact with his Claudean Kinds at [phone] to provide written clarification of the back pain. Pt stated it is very important that Dr. Yong Channel returns his call no matter the time. Cb# [phone]

## 2018-09-30 NOTE — Telephone Encounter (Signed)
Pt called back and said he was returning Dr Ansel Bong call and before I could help him he disconnected the call.

## 2018-09-30 NOTE — Telephone Encounter (Signed)
See note

## 2018-09-30 NOTE — Telephone Encounter (Signed)
Forwarding to Dr. Hunter.  

## 2018-09-30 NOTE — Telephone Encounter (Signed)
Attempted to call patient.  He did not pick up.  It did not go to voicemail.  I am going to be out of town starting on Friday and already have full clinic tomorrow- may not be able to touch base with him until the following week.  If you speak with him- please have specific questions listed from patient for me so that I can try to address these if I have a chance to get on the computer next week.  Also his attorney cannot provide [place] with a list of specific questions-unfortunately believe we need a designated party release to be able to speak with attorney.
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
## 2018-10-09 NOTE — Telephone Encounter (Signed)
Letter printed and mailed to patient.

## 2018-10-10 ENCOUNTER — Encounter: Payer: Self-pay | Admitting: Family Medicine

## 2018-10-14 ENCOUNTER — Encounter: Payer: Self-pay | Admitting: Family Medicine
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
## 2018-10-21 ENCOUNTER — Ambulatory Visit (INDEPENDENT_AMBULATORY_CARE_PROVIDER_SITE_OTHER): Payer: Federal, State, Local not specified - PPO | Admitting: Specialist

## 2019-01-11 ENCOUNTER — Encounter (INDEPENDENT_AMBULATORY_CARE_PROVIDER_SITE_OTHER): Payer: Self-pay | Admitting: Specialist

## 2019-02-17 ENCOUNTER — Encounter: Payer: Self-pay | Admitting: Specialist

## 2019-02-24 ENCOUNTER — Ambulatory Visit: Payer: Self-pay | Admitting: Specialist

## 2019-03-22 ENCOUNTER — Ambulatory Visit (INDEPENDENT_AMBULATORY_CARE_PROVIDER_SITE_OTHER): Payer: Federal, State, Local not specified - PPO | Admitting: Specialist

## 2019-03-22 ENCOUNTER — Other Ambulatory Visit: Payer: Self-pay

## 2019-03-22 ENCOUNTER — Encounter: Payer: Self-pay | Admitting: Specialist

## 2019-03-22 VITALS — BP 136/85 | HR 77 | Ht 69.0 in | Wt 184.0 lb

## 2019-03-22 DIAGNOSIS — M48062 Spinal stenosis, lumbar region with neurogenic claudication: Secondary | ICD-10-CM | POA: Diagnosis not present

## 2019-03-22 DIAGNOSIS — M5116 Intervertebral disc disorders with radiculopathy, lumbar region: Secondary | ICD-10-CM | POA: Diagnosis not present

## 2019-03-22 MED ORDER — AMITRIPTYLINE HCL 10 MG PO TABS: 20.0000 mg | ORAL_TABLET | Freq: Every day | ORAL | 3 refills | Status: DC

## 2019-03-22 MED ORDER — NAPROXEN 500 MG PO TABS: 500.0000 mg | ORAL_TABLET | Freq: Two times a day (BID) | ORAL | 3 refills | Status: DC

## 2019-03-22 MED ORDER — TRAMADOL-ACETAMINOPHEN 37.5-325 MG PO TABS: 1.0000 | ORAL_TABLET | Freq: Four times a day (QID) | ORAL | 0 refills | Status: DC | PRN
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
## 2019-04-14 ENCOUNTER — Other Ambulatory Visit: Payer: Self-pay

## 2019-04-14 ENCOUNTER — Ambulatory Visit (INDEPENDENT_AMBULATORY_CARE_PROVIDER_SITE_OTHER): Payer: Federal, State, Local not specified - PPO | Admitting: Physical Medicine and Rehabilitation

## 2019-04-14 ENCOUNTER — Encounter: Payer: Self-pay | Admitting: Physical Medicine and Rehabilitation

## 2019-04-14 DIAGNOSIS — M79605 Pain in left leg: Secondary | ICD-10-CM

## 2019-04-14 DIAGNOSIS — R202 Paresthesia of skin: Secondary | ICD-10-CM | POA: Diagnosis not present

## 2019-04-14 DIAGNOSIS — R29898 Other symptoms and signs involving the musculoskeletal system: Secondary | ICD-10-CM

## 2019-04-14 NOTE — Progress Notes (Signed)
 .  Numeric Pain Rating Scale and Functional Assessment Average Pain 8   In the last MONTH (on 0-10 scale) has pain interfered with the following?  1. General activity like being  able to carry out your everyday physical activities such as walking, climbing stairs, carrying groceries, or moving a chair?  Rating(5)    

## 2019-04-15 NOTE — Procedures (Signed)
EMG & NCV Findings: All nerve conduction studies (as indicated in the following tables) were within normal limits.    All examined muscles (as indicated in the following table) showed no evidence of electrical instability.    Impression: Essentially NORMAL electrodiagnostic study of the left lower limb.  There is no significant electrodiagnostic evidence of nerve entrapment, lumbosacral plexopathy or lumbar radiculopathy.    As you know, purely sensory or demyelinating radiculopathies and chemical radiculitis may not be detected with this particular electrodiagnostic study.  Recommendations: 1.  Follow-up with referring physician. 2.  Continue current management of symptoms.  ___________________________ Laurence Spates FAAPMR Board Certified, American Board of Physical Medicine and Rehabilitation    Nerve Conduction Studies Anti Sensory Summary Table   Stim Site NR Peak (ms) Norm Peak (ms) P-T Amp (V) Norm P-T Amp Site1 Site2 Delta-P (ms) Dist (cm) Vel (m/s) Norm Vel (m/s)  Left Saphenous Anti Sensory (Ant Med Mall)  32.3C  14cm    3.5 <4.4 16.9 >2 14cm Ant Med Mall 3.5 0.0  >32  Left Sup Fibular Anti Sensory (Ant Lat Mall)  32.4C  14 cm    3.1 <4.4 16.8 >5.0 14 cm Ant Lat Mall 3.1 14.0 45 >32  Left Sural Anti Sensory (Lat Mall)  32.5C  Calf    3.7 <4.0 18.8 >5.0 Calf Lat Mall 3.7 14.0 38 >35   Motor Summary Table   Stim Site NR Onset (ms) Norm Onset (ms) O-P Amp (mV) Norm O-P Amp Site1 Site2 Delta-0 (ms) Dist (cm) Vel (m/s) Norm Vel (m/s)  Left Fibular Motor (Ext Dig Brev)  31.7C  Ankle    3.5 <6.1 3.5 >2.5 B Fib Ankle 6.3 30.2 48 >38  B Fib    9.8  1.2  Poplt B Fib 1.9 9.5 50 >40  Poplt    11.7  3.6         Left Tibial Motor (Abd Hall Brev)  32.7C  Ankle    4.0 <6.1 6.1 >3.0 Knee Ankle 8.0 37.5 47 >35  Knee    12.0  1.3          EMG   Side Muscle Nerve Root Ins Act Fibs Psw Amp Dur Poly Recrt Int Fraser Din Comment  Left AntTibialis Dp Br Peron L4-5 Nml Nml Nml Nml Nml 0 Nml  Nml   Left Fibularis Longus  Sup Br Peron L5-S1 Nml Nml Nml Nml Nml 0 Nml Nml   Left MedGastroc Tibial S1-2 Nml Nml Nml Nml Nml 0 Nml Nml   Left VastusMed Femoral L2-4 Nml Nml Nml Nml Nml 0 Nml Nml   Left BicepsFemS Sciatic L5-S1 Nml Nml Nml Nml Nml 0 Nml Nml     Nerve Conduction Studies Anti Sensory Left/Right Comparison   Stim Site L Lat (ms) R Lat (ms) L-R Lat (ms) L Amp (V) R Amp (V) L-R Amp (%) Site1 Site2 L Vel (m/s) R Vel (m/s) L-R Vel (m/s)  Saphenous Anti Sensory (Ant Med Mall)  32.3C  14cm 3.5   16.9   14cm Ant Med Mall     Sup Fibular Anti Sensory (Ant Lat Mall)  32.4C  14 cm 3.1   16.8   14 cm Ant Lat Mall 45    Sural Anti Sensory (Lat Mall)  32.5C  Calf 3.7   18.8   Calf Lat Mall 38     Motor Left/Right Comparison   Stim Site L Lat (ms) R Lat (ms) L-R Lat (ms) L Amp (mV) R Amp (mV)  L-R Amp (%) Site1 Site2 L Vel (m/s) R Vel (m/s) L-R Vel (m/s)  Fibular Motor (Ext Dig Brev)  31.7C  Ankle 3.5   3.5   B Fib Ankle 48    B Fib 9.8   1.2   Poplt B Fib 50    Poplt 11.7   3.6         Tibial Motor (Abd Hall Brev)  32.7C  Ankle 4.0   6.1   Knee Ankle 47    Knee 12.0   1.3            Waveforms:
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
## 2019-04-20 ENCOUNTER — Telehealth: Payer: Self-pay | Admitting: Specialist

## 2019-04-20 NOTE — Telephone Encounter (Signed)
Patient called stating he had nerve conduction study with Dr. Ernestina Patches on 04/14/19.  Patient states Dr. Ernestina Patches told him to contact Dr. Louanne Skye for the results.  Patient is requesting a return call.

## 2019-04-20 NOTE — Telephone Encounter (Signed)
I called and advised that he is sched for 05/06/2019 to review the NCS/EMG

## 2019-04-21 ENCOUNTER — Ambulatory Visit: Payer: Federal, State, Local not specified - PPO | Admitting: Specialist

## 2019-04-29 ENCOUNTER — Other Ambulatory Visit: Payer: Self-pay

## 2019-04-29 DIAGNOSIS — R6889 Other general symptoms and signs: Secondary | ICD-10-CM | POA: Diagnosis not present

## 2019-04-29 DIAGNOSIS — Z20822 Contact with and (suspected) exposure to covid-19: Secondary | ICD-10-CM

## 2019-05-05 LAB — NOVEL CORONAVIRUS, NAA: SARS-CoV-2, NAA: NOT DETECTED

## 2019-05-06 ENCOUNTER — Encounter: Payer: Self-pay | Admitting: Specialist

## 2019-05-06 ENCOUNTER — Ambulatory Visit (INDEPENDENT_AMBULATORY_CARE_PROVIDER_SITE_OTHER): Payer: Federal, State, Local not specified - PPO | Admitting: Specialist

## 2019-05-06 VITALS — BP 126/85 | HR 69 | Ht 69.0 in | Wt 184.0 lb

## 2019-05-06 DIAGNOSIS — M4807 Spinal stenosis, lumbosacral region: Secondary | ICD-10-CM | POA: Diagnosis not present

## 2019-05-06 DIAGNOSIS — M5116 Intervertebral disc disorders with radiculopathy, lumbar region: Secondary | ICD-10-CM

## 2019-05-06 MED ORDER — ALPRAZOLAM 0.5 MG PO TABS: ORAL_TABLET | ORAL | 0 refills | Status: DC

## 2019-05-06 NOTE — Patient Instructions (Signed)
Avoid bending, stooping and avoid lifting weights greater than 10 lbs. Avoid prolong standing and walking. Avoid frequent bending and stooping  No lifting greater than 10 lbs. May use ice or moist heat for pain. Weight loss is of benefit. Handicap license is approved. MRI of the lumbar spine is ordered as the last MRI is nearly 31 months old.

## 2019-05-06 NOTE — Progress Notes (Signed)
Office Visit Note   Patient: Troy Christian           Date of Birth: [birth year]           MRN: [id number] Visit Date: 05/06/2019              Requested by: Marin Olp, MD [place],  [place] [id number] PCP: Marin Olp, MD   Assessment & Plan: Visit Diagnoses:  1. Spinal stenosis of lumbosacral region     Plan: Avoid bending, stooping and avoid lifting weights greater than 10 lbs. Avoid prolong standing and walking. Avoid frequent bending and stooping  No lifting greater than 10 lbs. May use ice or moist heat for pain. Weight loss is of benefit. Handicap license is approved. MRI of the lumbar spine is ordered as the last MRI is nearly 44 months old.  Follow-Up Instructions: Return in about 3 weeks (around 05/27/2019).   Orders:  No orders of the defined types were placed in this encounter.  No orders of the defined types were placed in this encounter.     Procedures: No procedures performed   Clinical Data: No additional findings.   Subjective: Chief Complaint  Patient presents with  . Left Leg - Follow-up    EMG/NCS Results    63 year old male with history of low back pain and radiation into the legs, it is mostly into both the back and left greater than right leg pain.  No bowel or bladder difficulty. No pain with cough or sneeze. He has difficullty with standing and walking. Has difficulty with sleeping and when he moves the pain comes and he wakes up. He has difficutly with prolong standing and on the airplane he had to be moved to another area so he could lie dow. It was very bad.   Review of Systems  Constitutional: Positive for unexpected weight change. Negative for activity change, appetite change, chills, diaphoresis, fatigue and fever.  HENT: Positive for sinus pressure and sinus pain. Negative for congestion, dental problem, drooling, ear discharge, ear pain, facial swelling, hearing loss, mouth sores, nosebleeds, postnasal  drip, rhinorrhea, sneezing, sore throat, tinnitus, trouble swallowing and voice change.   Eyes: Negative for photophobia, pain, discharge, redness, itching and visual disturbance.  Respiratory: Negative.   Cardiovascular: Negative.   Gastrointestinal: Negative.   Endocrine: Negative.  Negative for cold intolerance, heat intolerance, polydipsia, polyphagia and polyuria.  Genitourinary: Negative.  Negative for difficulty urinating, dysuria, enuresis, flank pain, frequency, hematuria and urgency.  Musculoskeletal: Positive for arthralgias, back pain, gait problem, neck pain and neck stiffness.  Skin: Negative.   Allergic/Immunologic: Negative for environmental allergies, food allergies and immunocompromised state.  Neurological: Positive for weakness and numbness.  Hematological: Negative.   Psychiatric/Behavioral: Negative for agitation, behavioral problems, confusion, decreased concentration, dysphoric mood, hallucinations, self-injury and suicidal ideas. The patient is not nervous/anxious and is not hyperactive.      Objective: Vital Signs: BP 126/85 (BP Location: Left Arm, Patient Position: Sitting)   Pulse 69   Ht 5\' 9"  (1.753 m)   Wt 184 lb (83.5 kg)   BMI 27.17 kg/m   Physical Exam Constitutional:      Appearance: He is well-developed.  HENT:     Head: Normocephalic and atraumatic.  Eyes:     Pupils: Pupils are equal, round, and reactive to light.  Neck:     Musculoskeletal: Normal range of motion and neck supple.  Pulmonary:     Effort: Pulmonary  effort is normal.     Breath sounds: Normal breath sounds.  Abdominal:     General: Bowel sounds are normal.     Palpations: Abdomen is soft.  Skin:    General: Skin is warm and dry.  Neurological:     Mental Status: He is alert and oriented to person, place, and time.  Psychiatric:        Behavior: Behavior normal.        Thought Content: Thought content normal.        Judgment: Judgment normal.     Back Exam    Tenderness  The patient is experiencing tenderness in the lumbar and cervical.  Range of Motion  Extension: abnormal  Flexion: abnormal  Lateral bend right: abnormal  Lateral bend left: abnormal  Rotation right: abnormal  Rotation left: abnormal   Muscle Strength  Right Quadriceps:  5/5  Left Quadriceps:  4/5  Right Hamstrings:  5/5  Left Hamstrings:  5/5   Tests  Straight leg raise right: negative Straight leg raise left: negative  Reflexes  Patellar: 1/4 Achilles: 2/4 Babinski's sign: normal   Other  Toe walk: abnormal Sensation: decreased Gait: abnormal  Erythema: no back redness Scars: absent  Comments:  Left knee extension weakness 4/5 and left hip flexion weakness 4/5.      Specialty Comments:  No specialty comments available.  Imaging: No results found.   PMFS History: Patient Active Problem List   Diagnosis Date Noted  . Spinal stenosis of lumbar region with neurogenic claudication 02/27/2018  . Former smoker 01/07/2017  . Hx of foreign travel 01/07/2017  . BPH (benign prostatic hyperplasia) 01/07/2017  . Hyperlipidemia   . Elevated blood pressure reading    Past Medical History:  Diagnosis Date  . Elevated blood pressure reading    states up with stress.   . Hyperlipidemia    no rx    Family History  Problem Relation Age of Onset  . Hypertension Mother   . Pneumonia Mother        lived to 26  . Hypertension Father   . Prostate cancer Father        lived to 71  . Asthma Sister        due to asthma  . Prostate cancer Brother        he is a gynecologist in [place]    Past Surgical History:  Procedure Laterality Date  . MOUTH SURGERY    . none     Social History   Occupational History  . Not on file  Tobacco Use  . Smoking status: Former Smoker    Packs/day: 0.40    Years: 20.00    Pack years: 8.00    Types: Pipe, Cigars    Quit date: 10/14/2008    Years since quitting: 10.5  . Smokeless tobacco: Never Used  Substance  and Sexual Activity  . Alcohol use: Yes    Comment: occasionally  . Drug use: No  . Sexual activity: Yes

## 2019-06-03 ENCOUNTER — Ambulatory Visit
Admission: RE | Admit: 2019-06-03 | Discharge: 2019-06-03 | Disposition: A | Discharge status: Home / Self Care | Payer: Federal, State, Local not specified - PPO | Source: Ambulatory Visit | Attending: Specialist | Admitting: Specialist

## 2019-06-03 ENCOUNTER — Other Ambulatory Visit: Payer: Self-pay

## 2019-06-03 DIAGNOSIS — M4807 Spinal stenosis, lumbosacral region: Secondary | ICD-10-CM

## 2019-06-03 DIAGNOSIS — M48061 Spinal stenosis, lumbar region without neurogenic claudication: Secondary | ICD-10-CM | POA: Diagnosis not present

## 2019-06-09 ENCOUNTER — Ambulatory Visit (INDEPENDENT_AMBULATORY_CARE_PROVIDER_SITE_OTHER): Payer: Federal, State, Local not specified - PPO | Admitting: Specialist

## 2019-06-09 ENCOUNTER — Encounter: Payer: Self-pay | Admitting: Specialist

## 2019-06-09 VITALS — BP 127/80 | HR 70 | Ht 69.0 in | Wt 184.0 lb

## 2019-06-09 DIAGNOSIS — M5126 Other intervertebral disc displacement, lumbar region: Secondary | ICD-10-CM

## 2019-06-09 DIAGNOSIS — M5416 Radiculopathy, lumbar region: Secondary | ICD-10-CM | POA: Diagnosis not present

## 2019-06-09 DIAGNOSIS — M48062 Spinal stenosis, lumbar region with neurogenic claudication: Secondary | ICD-10-CM | POA: Diagnosis not present
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
## 2019-06-09 NOTE — Patient Instructions (Addendum)
Plan:Avoid bending, stooping and avoid lifting weights greater than 10 lbs. Avoid prolong standing and walking. Order for a new walker with wheels. Surgery scheduling secretary Kandice Hams, will call you in the next week to schedule for surgery. Surgery  Recommended would be a two level lumbar laminectomy  L3-4 and L4-5 this would be done with microscope. Take tramadol and naprosyn for for pain. Risk of surgery includes risk of infection 1 in 300 patients, bleeding 1/200% chance you would need a transfusion.   Risk to the nerves is one in 10,000. Expect improved walking and standing tolerance. Expect relief of leg pain but numbness may persist depending on the length and degree of pressure that has been present.

## 2019-07-21 ENCOUNTER — Other Ambulatory Visit: Payer: Self-pay | Admitting: Specialist

## 2019-08-16 ENCOUNTER — Telehealth: Payer: Self-pay | Admitting: Specialist

## 2019-08-16 NOTE — Telephone Encounter (Signed)
Patient called stating that he is no longer feeling the pain and is not taking any pain medication, he wanted to make Dr. Louanne Skye aware.  CB#[phone].  Thank you.

## 2019-08-16 NOTE — Telephone Encounter (Signed)
FYI

## 2019-09-16 ENCOUNTER — Other Ambulatory Visit: Payer: Self-pay

## 2019-09-16 DIAGNOSIS — Z20822 Contact with and (suspected) exposure to covid-19: Secondary | ICD-10-CM

## 2019-09-20 LAB — NOVEL CORONAVIRUS, NAA: SARS-CoV-2, NAA: NOT DETECTED

## 2019-10-17 IMAGING — MR MRI LUMBAR SPINE WITHOUT CONTRAST
5 series · 45 of 48 positions shown · non-contrast
Comparison: 02/06/2018

CLINICAL DATA: Low back pain with left-sided radiculopathy

EXAM:
MRI LUMBAR SPINE WITHOUT CONTRAST
TECHNIQUE: Multiplanar, multisequence MR imaging of the lumbar spine was
performed. No intravenous contrast was administered.

[Series 3: T2 post-contrast · sagittal · 4.0mm · 0.88mm/px · 6 of 13 slices shown]
[im 1/13]
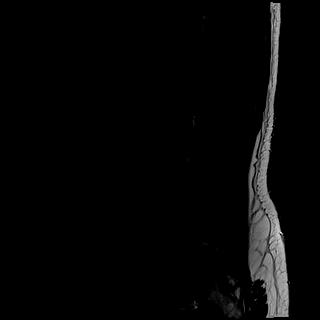
[im 3/13]
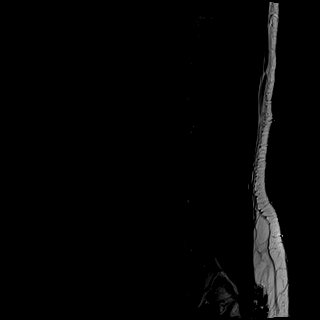
[im 5/13]
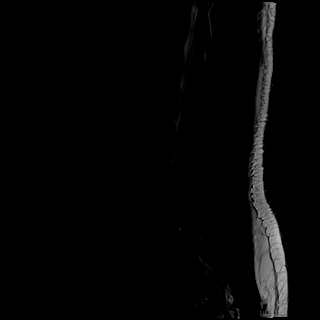
[im 8/13]
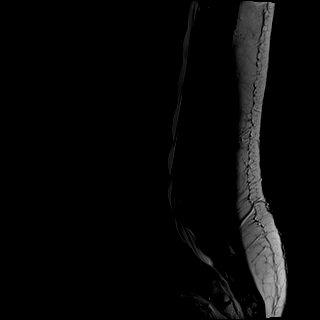
[im 10/13]
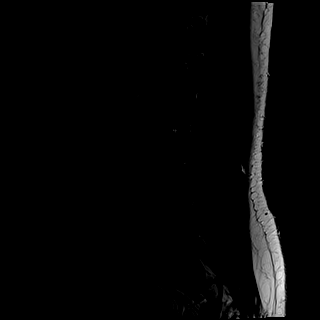
[im 13/13]
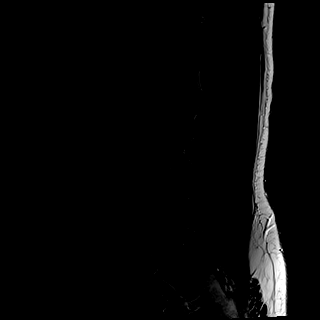

[Series 4: T1 · sagittal · 4.0mm · 0.88mm/px · 5 of 13 slices shown (1 of 2)]
[im 1/13]
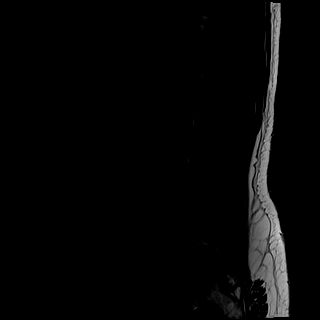
[im 4/13]
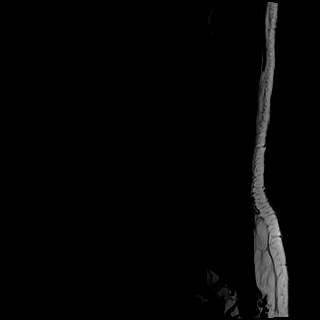
[im 7/13]
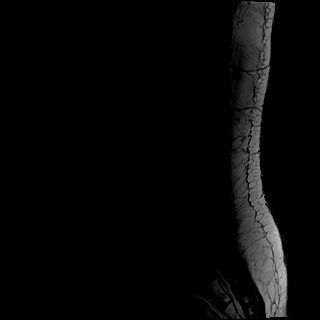
[im 10/13]
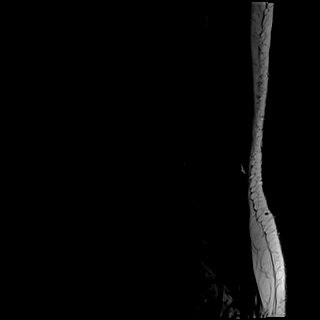
[im 13/13]
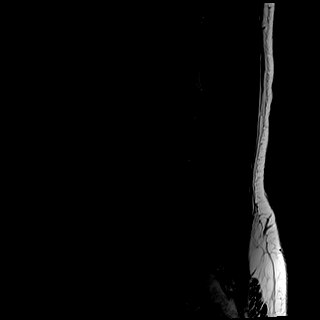

[Series 5: tirm sag · sagittal · 4.0mm · 0.55mm/px · 5 of 13 slices shown]
[im 1/13]
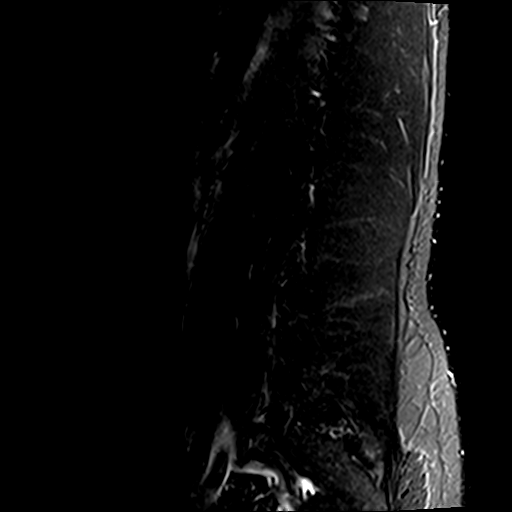
[im 4/13]
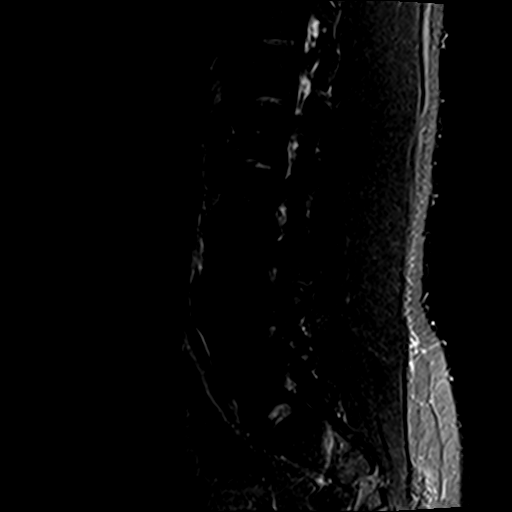
[im 7/13]
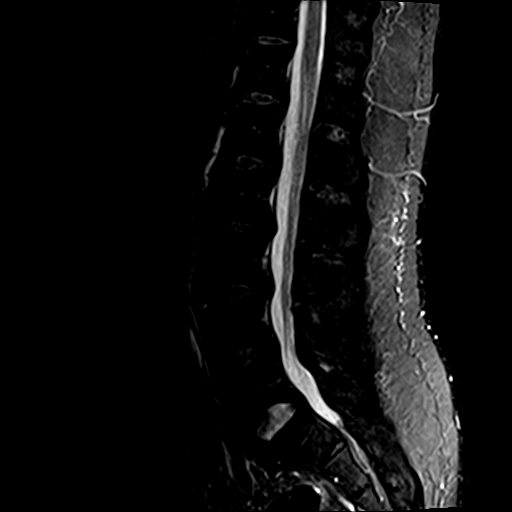
[im 10/13]
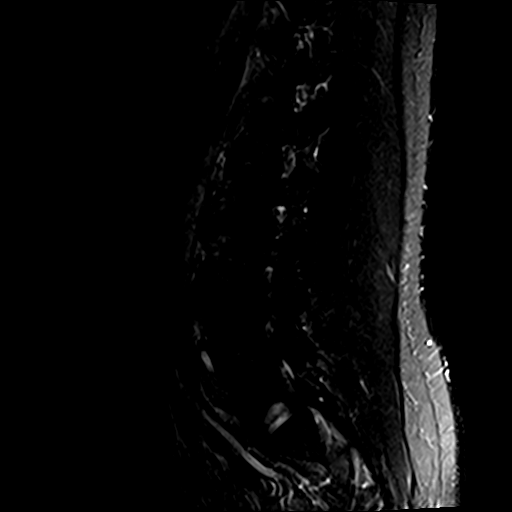
[im 13/13]
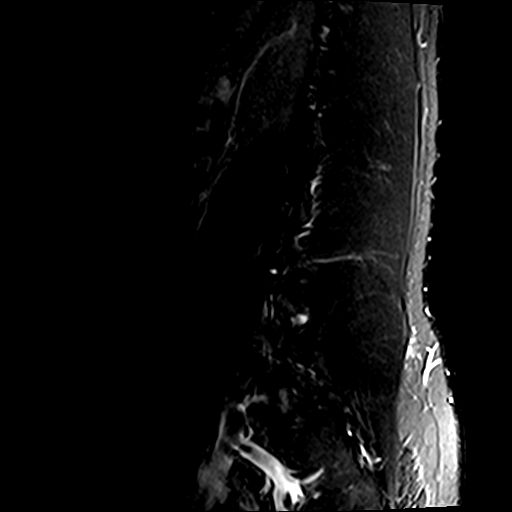

[Series 6: T1 · axial · 4.0mm · 0.78mm/px · z∈[-134,+98]mm · 13 of 39 slices shown (2 of 2)]
[im 1/39]
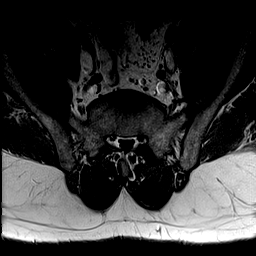
[im 3/39]
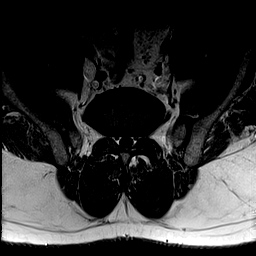
[im 6/39]
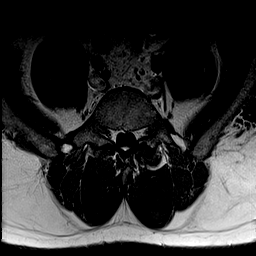
[im 8/39]
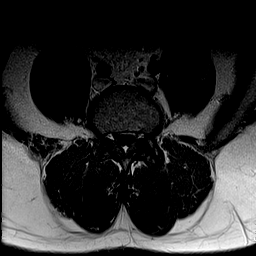
[im 11/39]
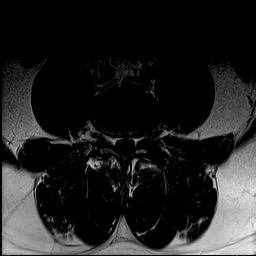
[im 13/39]
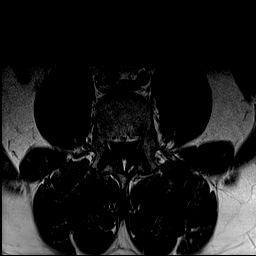
[im 16/39]
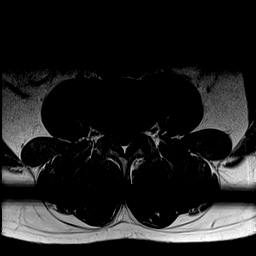
[im 18/39]
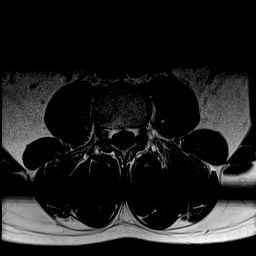
[im 21/39]
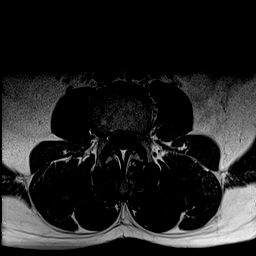
[im 23/39]
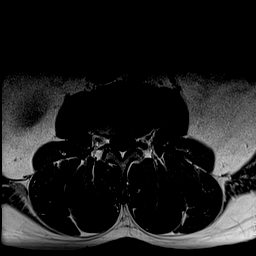
[im 28/39]
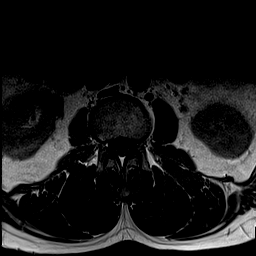
[im 33/39]
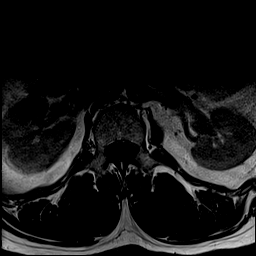
[im 39/39]
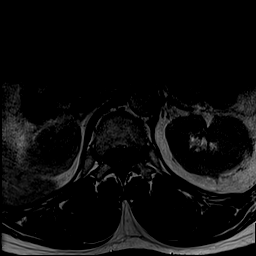

[Series 7: T2 · axial · 4.0mm · 0.78mm/px · z∈[-134,+98]mm · 16 of 39 slices shown]
[im 1/39]
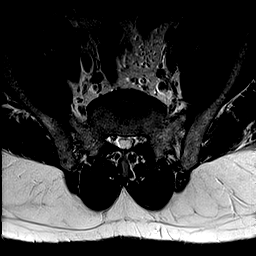
[im 3/39]
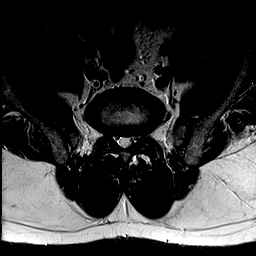
[im 6/39]
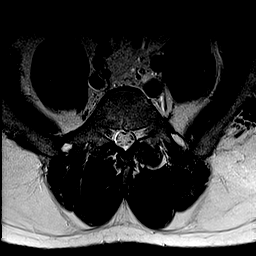
[im 8/39]
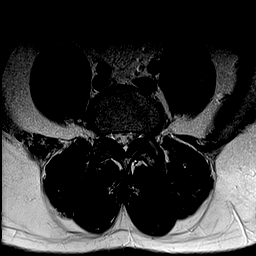
[im 11/39]
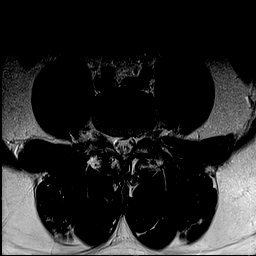
[im 13/39]
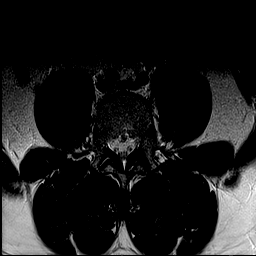
[im 16/39]
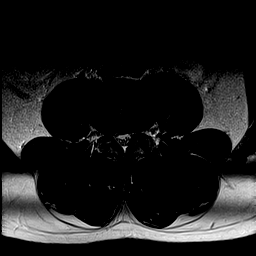
[im 18/39]
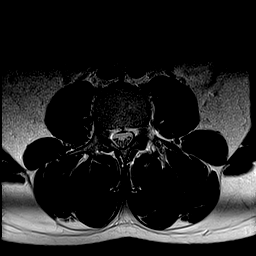
[im 21/39]
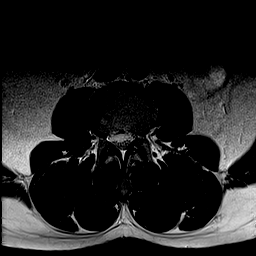
[im 23/39]
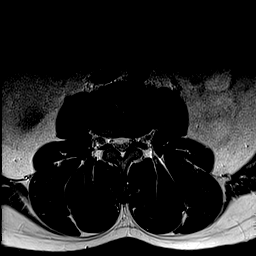
[im 26/39]
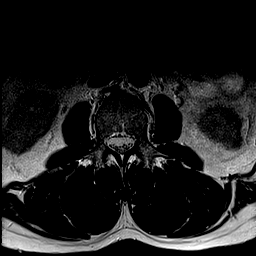
[im 28/39]
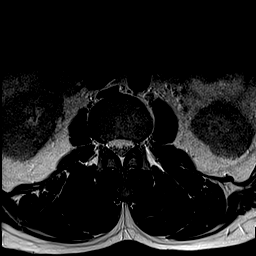
[im 31/39]
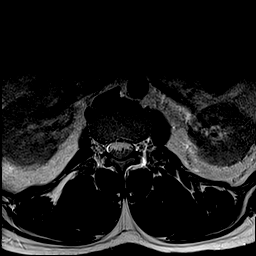
[im 33/39]
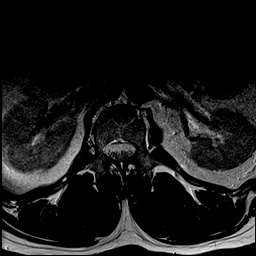
[im 36/39]
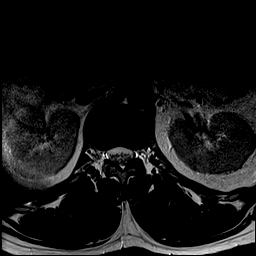
[im 39/39]
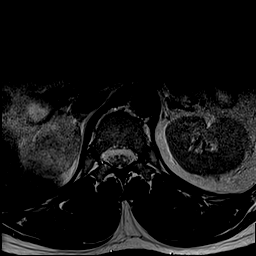

[45 of 48 positions shown; findings below may reference images not displayed]

FINDINGS: Segmentation:  Standard.

Alignment:  Physiologic.

Vertebrae: No fracture, evidence of discitis, or bone lesion. Mild
diffuse intrinsic canal narrowing on the basis of congenitally short
pedicles.

Conus medullaris and cauda equina: Conus extends to the T12-L1
level. Conus and cauda equina appear normal.

Paraspinal and other soft tissues: Negative.

Disc levels:

T12-L1: No significant disc protrusion, foraminal stenosis, or canal
stenosis.

L1-L2: No significant disc protrusion, foraminal stenosis, or canal
stenosis.

L2-L3: Mild diffuse disc bulge with mild bilateral facet arthrosis
and ligamentum flavum buckling. No foraminal stenosis. Borderline
canal stenosis. No change from prior.

L3-L4: Diffuse disc bulge, eccentric to the right with a posterior
annular fissure which is new from prior. Mild to moderate bilateral
facet arthrosis and buckling of the ligamentum flavum result in
mild-to-moderate left and mild right foraminal stenosis. Right
greater than left subarticular recess stenosis and moderate canal
stenosis. No significant interval progression in the degree of
stenosis.

L4-L5: Diffuse disc bulge, left worse than right facet arthropathy,
and ligamentum flavum buckling result in moderate left and mild
right foraminal stenosis with moderate to severe canal stenosis.
There is narrowing of the subarticular recesses bilaterally. No
significant interval progression.

L5-S1: Diffuse disc bulge with moderate bilateral facet arthrosis
without significant foraminal or canal stenosis. No change from
prior.
IMPRESSION: 1. Multilevel lumbar spondylosis, most pronounced at the L3-4 level
where there is moderate canal stenosis. Overall, no interval
progression compared to prior MRI 02/06/2018.
[DATE]. Posterior annular fissure at L3-4, which appears new from prior.

## 2020-07-03 ENCOUNTER — Telehealth: Payer: Self-pay

## 2020-07-03 NOTE — Telephone Encounter (Signed)
Pt has been out of the country and requesting a physical. He is going back out of the country in December. He is requesting to be seen before then. Can I use a Same Day?

## 2020-07-03 NOTE — Telephone Encounter (Signed)
How long will he be out of the country?  Can we schedule him when he returns?  We are overbooked on physicals right now-perhaps we could put him on cancellation list?

## 2020-07-04 NOTE — Telephone Encounter (Signed)
Dr Yong Channel had cancellation, so I got him scheduled this Thursday

## 2020-07-06 ENCOUNTER — Ambulatory Visit (INDEPENDENT_AMBULATORY_CARE_PROVIDER_SITE_OTHER): Payer: Federal, State, Local not specified - PPO | Admitting: Family Medicine

## 2020-07-06 ENCOUNTER — Other Ambulatory Visit: Payer: Self-pay

## 2020-07-06 ENCOUNTER — Encounter: Payer: Self-pay | Admitting: Family Medicine

## 2020-07-06 VITALS — BP 110/62 | HR 72 | Temp 98.4°F | Resp 18 | Ht 69.0 in | Wt 185.2 lb

## 2020-07-06 DIAGNOSIS — Z114 Encounter for screening for human immunodeficiency virus [HIV]: Secondary | ICD-10-CM

## 2020-07-06 DIAGNOSIS — Z87891 Personal history of nicotine dependence: Secondary | ICD-10-CM

## 2020-07-06 DIAGNOSIS — Z23 Encounter for immunization: Secondary | ICD-10-CM

## 2020-07-06 DIAGNOSIS — Z1211 Encounter for screening for malignant neoplasm of colon: Secondary | ICD-10-CM | POA: Diagnosis not present

## 2020-07-06 DIAGNOSIS — Z118 Encounter for screening for other infectious and parasitic diseases: Secondary | ICD-10-CM

## 2020-07-06 DIAGNOSIS — Z125 Encounter for screening for malignant neoplasm of prostate: Secondary | ICD-10-CM

## 2020-07-06 DIAGNOSIS — Z0000 Encounter for general adult medical examination without abnormal findings: Secondary | ICD-10-CM | POA: Diagnosis not present

## 2020-07-06 DIAGNOSIS — M48062 Spinal stenosis, lumbar region with neurogenic claudication: Secondary | ICD-10-CM

## 2020-07-06 DIAGNOSIS — N401 Enlarged prostate with lower urinary tract symptoms: Secondary | ICD-10-CM

## 2020-07-06 DIAGNOSIS — N3943 Post-void dribbling: Secondary | ICD-10-CM

## 2020-07-06 DIAGNOSIS — E785 Hyperlipidemia, unspecified: Secondary | ICD-10-CM | POA: Diagnosis not present

## 2020-07-06 DIAGNOSIS — Z113 Encounter for screening for infections with a predominantly sexual mode of transmission: Secondary | ICD-10-CM

## 2020-07-06 NOTE — Patient Instructions (Addendum)
Please stop by lab before you go If you have mychart- we will send your results within 3 business days of [place] receiving them.  If you do not have mychart- we will call you about results within 5 business days of [place] receiving them.  *please note we are currently using [facility] which has a longer processing time than [facility] typically so labs may not come back as quickly as in the past *please also note that you will see labs on mychart as soon as they post. I will later go in and write notes on them- will say "notes from Dr. Yong Channel"  We will call you within two weeks about your referral to Gastroenterol[facility]. If you do not hear within 3 weeks, give [place] a call.   Health Maintenance Due  Topic Date Due  . TETANUS/TDAP today Never done  . INFLUENZA VACCINE In office flu shot today 05/14/2020   Team please get dates of his covid vaccines for pifizer and input

## 2020-07-06 NOTE — Progress Notes (Signed)
Phone: [phone]   Subjective:  Patient presents today for their annual physical. Chief complaint-noted.   See problem oriented charting- ROS- full  review of systems was completed and negative  except for: slow urinary stream  The following were reviewed and entered/updated in epic: Past Medical History:  Diagnosis Date  . Elevated blood pressure reading    states up with stress.   . Hyperlipidemia    no rx   Patient Active Problem List   Diagnosis Date Noted  . Hyperlipidemia     Priority: High  . Spinal stenosis of lumbar region with neurogenic claudication 02/27/2018    Priority: Medium  . BPH (benign prostatic hyperplasia) 01/07/2017    Priority: Medium  . Former smoker 01/07/2017    Priority: Low  . Hx of foreign travel 01/07/2017    Priority: Low  . Elevated blood pressure reading     Priority: Low   Past Surgical History:  Procedure Laterality Date  . MOUTH SURGERY    . none      Family History  Problem Relation Age of Onset  . Hypertension Mother   . Pneumonia Mother        lived to 77  . Hypertension Father   . Prostate cancer Father        lived to 69  . Asthma Sister        due to asthma  . Prostate cancer Brother        he is a gynecologist in [place]    Medications- reviewed and updated No current outpatient medications on file.   No current facility-administered medications for this visit.    Allergies-reviewed and updated Allergies  Allergen Reactions  . Penicillins     unknown    Social History   Social History Narrative   Lives alone. Single. Dating long distance. Has more than 1 partner but uses protection   From [place]. Has been to 75 countries.       Retired around 34. 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 Oropharynx normal Neck: no thyromegaly CV: RRR no murmurs rubs or gallops Lungs: CTAB no crackles, wheeze, rhonchi Abdomen: soft/nontender/nondistended/normal bowel sounds. No rebound or guarding.  Ext: no edema Skin: warm, dry Neuro: grossly normal, moves all extremities, PERRLA    Assessment and Plan  64 y.o. male presenting for annual physical.  Health Maintenance counseling: 1. Anticipatory guidance: Patient counseled regarding regular dental exams q6 months, eye exams yearly- will schedule,  avoiding smoking and second hand smoke, limiting alcohol to 2 beverages per day- doesn't drink 2. Risk factor reduction:  Advised patient of need for regular exercise and diet rich and fruits and vegetables to reduce risk of heart attack and stroke. Exercise- doing some weights at home and some cardio- recommend 150 minutes a week. Diet-limited meat 1 meat a week- more fruits and vegetables.  Wt Readings from Last 3 Encounters:  07/06/20 185 lb 3.2 oz (84 kg)  06/09/19 184 lb (83.5 kg)  05/06/19 184 lb (83.5 kg)  3. Immunizations/screenings/ancillary studies-recommended flu shot and tetanus shot today .  COVID-19 vaccination- Team please get dates of his covid vaccines for pifizer and input  Immunization History  Administered Date(s) Administered  . Influenza,inj,Quad PF,6+ Mos 06/28/2019  . Influenza-Unspecified 08/28/2016, 08/12/2017  4. Prostate cancer screening- we will trend PSA with labs today Lab Results  Component Value Date   PSA 1.56 01/07/2017   5. Colon cancer screening - has never had colonoscopy-referred today 6. Skin cancer screening-low risk due to melanin content. advised regular sunscreen use. Denies worrisome, changing, or new skin  lesions.  7. Std screening- opts in. Recommended portection  8.  former smoker - quit in 2010.  8 pack years.  Will recommend AAA screening at 83.  We will get urinalysis  Status of chronic or acute concerns   #social update- just got back from [place] and leaving again later this year  #hyperlipidemia S: Medication: None. Reports check in [place] after dietary changes and that helps Lab Results  Component Value Date   CHOL 222 (H) 01/07/2017   HDL 42.20 01/07/2017   LDLCALC 160 (H) 01/07/2017   TRIG 99.0 01/07/2017   CHOLHDL 5 01/07/2017   A/P: Update lipid panel and calculate ASCVD risk  #Spinal stenosis-much improved-previously followed with Dr. Louanne Skye. Feels  #reports had CT scan in [place] and was told had small kidney stones that would likely flush out  #Elevated blood pressure readings in the past-blood pressure looks excellent   #some slow urinary stream- not at point wher wants to take meds- psa as above  Recommended follow up: Return in about 1 year (around 07/06/2021) for physical or sooner if needed.  Lab/Order associations:Non fasting   ICD-10-CM   1. Preventative health care  B55.97 COMPLETE METABOLIC PANEL WITH GFR    CBC    Lipid panel    PSA    Ambulatory referral to Gastroenterology  2. Hyperlipidemia, unspecified hyperlipidemia type  C16.3 COMPLETE METABOLIC PANEL WITH GFR    CBC    Lipid panel  3. Encounter for screening colonoscopy  Z12.11 Ambulatory referral to Gastroenterology  4. Screening for prostate cancer  Z12.5 PSA  5. Spinal stenosis of lumbar region with neurogenic claudication  M48.062   6. Benign prostatic hyperplasia with post-void dribbling  N40.1    N39.43   7. Former smoker  Z87.891     No orders of the defined types were placed in this encounter.   Return precautions advised.  Garret Reddish, MD

## 2020-07-07 ENCOUNTER — Encounter: Payer: Self-pay | Admitting: Family Medicine

## 2020-07-07 LAB — COMPLETE METABOLIC PANEL WITH GFR
AG Ratio: 2 (calc) (ref 1.0–2.5)
ALT: 16 U/L (ref 9–46)
AST: 19 U/L (ref 10–35)
Albumin: 4.7 g/dL (ref 3.6–5.1)
Alkaline phosphatase (APISO): 99 U/L (ref 35–144)
BUN/Creatinine Ratio: 12 (calc) (ref 6–22)
BUN: 17 mg/dL (ref 7–25)
CO2: 24 mmol/L (ref 20–32)
Calcium: 9.8 mg/dL (ref 8.6–10.3)
Chloride: 104 mmol/L (ref 98–110)
Creat: 1.4 mg/dL — ABNORMAL HIGH (ref 0.70–1.25)
GFR, Est African American: 62 mL/min/{1.73_m2} (ref >=60)
GFR, Est Non African American: 53 mL/min/{1.73_m2} — ABNORMAL LOW (ref >=60)
Globulin: 2.4 g/dL (calc) (ref 1.9–3.7)
Glucose, Bld: 92 mg/dL (ref 65–99)
Potassium: 4.2 mmol/L (ref 3.5–5.3)
Sodium: 137 mmol/L (ref 135–146)
Total Bilirubin: 0.6 mg/dL (ref 0.2–1.2)
Total Protein: 7.1 g/dL (ref 6.1–8.1)

## 2020-07-07 LAB — RPR: RPR Ser Ql: NONREACTIVE

## 2020-07-07 LAB — CBC
HCT: 46.4 % (ref 38.5–50.0)
Hemoglobin: 15.5 g/dL (ref 13.2–17.1)
MCH: 30.3 pg (ref 27.0–33.0)
MCHC: 33.4 g/dL (ref 32.0–36.0)
MCV: 90.8 fL (ref 80.0–100.0)
MPV: 10.6 fL (ref 7.5–12.5)
Platelets: 248 10*3/uL (ref 140–400)
RBC: 5.11 10*6/uL (ref 4.20–5.80)
RDW: 13 % (ref 11.0–15.0)
WBC: 3.9 10*3/uL (ref 3.8–10.8)

## 2020-07-07 LAB — LIPID PANEL
Cholesterol: 208 mg/dL — ABNORMAL HIGH (ref <200)
HDL: 46 mg/dL (ref >=40)
LDL Cholesterol (Calc): 129 mg/dL (calc) — ABNORMAL HIGH
Non-HDL Cholesterol (Calc): 162 mg/dL (calc) — ABNORMAL HIGH (ref <130)
Total CHOL/HDL Ratio: 4.5 (calc) (ref <5.0)
Triglycerides: 191 mg/dL — ABNORMAL HIGH (ref <150)

## 2020-07-07 LAB — HIV ANTIBODY (ROUTINE TESTING W REFLEX): HIV 1&2 Ab, 4th Generation: NONREACTIVE

## 2020-07-07 LAB — PSA: PSA: 1.57 ng/mL (ref <=4.0)

## 2020-07-08 ENCOUNTER — Other Ambulatory Visit: Payer: Self-pay | Admitting: *Deleted

## 2020-07-08 DIAGNOSIS — R7989 Other specified abnormal findings of blood chemistry: Secondary | ICD-10-CM

## 2020-07-08 NOTE — Telephone Encounter (Signed)
Called pt and scheduled lab appt.

## 2020-08-07 ENCOUNTER — Ambulatory Visit (INDEPENDENT_AMBULATORY_CARE_PROVIDER_SITE_OTHER): Payer: Federal, State, Local not specified - PPO | Admitting: Family Medicine

## 2020-08-07 ENCOUNTER — Other Ambulatory Visit: Payer: Self-pay

## 2020-08-07 ENCOUNTER — Other Ambulatory Visit: Payer: Federal, State, Local not specified - PPO

## 2020-08-07 ENCOUNTER — Telehealth: Payer: Self-pay

## 2020-08-07 ENCOUNTER — Other Ambulatory Visit (HOSPITAL_COMMUNITY)
Admission: RE | Admit: 2020-08-07 | Discharge: 2020-08-07 | Disposition: A | Discharge status: Home / Self Care | Payer: Federal, State, Local not specified - PPO | Source: Ambulatory Visit | Attending: Family Medicine | Admitting: Family Medicine

## 2020-08-07 ENCOUNTER — Other Ambulatory Visit: Payer: Self-pay | Admitting: Family Medicine

## 2020-08-07 ENCOUNTER — Encounter: Payer: Self-pay | Admitting: Family Medicine

## 2020-08-07 VITALS — BP 138/88 | HR 58 | Wt 183.6 lb

## 2020-08-07 DIAGNOSIS — Z118 Encounter for screening for other infectious and parasitic diseases: Secondary | ICD-10-CM | POA: Diagnosis not present

## 2020-08-07 DIAGNOSIS — Z113 Encounter for screening for infections with a predominantly sexual mode of transmission: Secondary | ICD-10-CM | POA: Diagnosis not present

## 2020-08-07 DIAGNOSIS — Z1211 Encounter for screening for malignant neoplasm of colon: Secondary | ICD-10-CM

## 2020-08-07 DIAGNOSIS — F419 Anxiety disorder, unspecified: Secondary | ICD-10-CM | POA: Diagnosis not present

## 2020-08-07 DIAGNOSIS — R7989 Other specified abnormal findings of blood chemistry: Secondary | ICD-10-CM | POA: Diagnosis not present

## 2020-08-07 DIAGNOSIS — E785 Hyperlipidemia, unspecified: Secondary | ICD-10-CM | POA: Diagnosis not present

## 2020-08-07 DIAGNOSIS — R002 Palpitations: Secondary | ICD-10-CM

## 2020-08-07 DIAGNOSIS — Z8249 Family history of ischemic heart disease and other diseases of the circulatory system: Secondary | ICD-10-CM

## 2020-08-07 MED ORDER — BUSPIRONE HCL 5 MG PO TABS: 5.0000 mg | ORAL_TABLET | Freq: Three times a day (TID) | ORAL | 2 refills | Status: DC | PRN

## 2020-08-07 NOTE — Telephone Encounter (Signed)
Please schedule pt for OV, with palpitations he will need a visit OR he can go to urgent care to have an ekg done if no appointments available today.

## 2020-08-07 NOTE — Addendum Note (Signed)
Addended by: Doran Clay A on: 08/07/2020 01:07 PM   Modules accepted: Orders

## 2020-08-07 NOTE — Assessment & Plan Note (Signed)
#   Palpitations #Family history of coronary artery disease #Anxiety S:brother died of heart disease at age 62 suddenly as well as 1st cousin on dad's side. Multiple brothers have had high blood pressure issues but he has not. He states he has a fair amount of anxiety. About 20 years ago had similar and he wore a heart monitor for a month and he states nothing was detected. Went to physician in [place] and was told EKG normal- given lorazepam 1 mg when has palpitations- it resolves the symptoms- usually takes 3 days in a row. 28 tablets lasted a year.   Recently he has a lot of stressors and dealing with grieving- stress is much higher. A lot of stress with taxes/IRS, etc.   Palpitations 50% of the day in the last month- almost everyday but does miss osme days.  No other symptoms such as chest pain, shortness of breath, left arm or neck pain  Orthostatic vital signs negative today A/P: 64 year old male with palpitations over the last month starting after loss of his brother at age 76 to heart disease.  EKG reassuring today.  Patient was not having palpitations at time of EKG-we will get 10-day heart monitor.  Patient was coming by for BMP today-we will see if we can add a TSH  Due to family history patient strongly requests referral to cardiology-EKG was reassuring today but he would like to move forward with this requested-referral was placed  Patient also reports prior work-up with cardiac monitoring sounds like an [place]-reportedly was started on alprazolam for anxiety and has used 26 pills over the last year-recommended trial of buspirone instead.  Actually prior to that recommended counseling patient declines

## 2020-08-07 NOTE — Telephone Encounter (Signed)
Pt is coming in for lab work now. He called and asked for an ekg and to see Dr. Yong Channel for 5 minutes. I told him Dr. Yong Channel was full and most likely could not see him, but he asked that I ask Dr. Yong Channel. Pt states he has been having heart palpitations.

## 2020-08-07 NOTE — Patient Instructions (Addendum)
Health Maintenance Due  Topic Date Due  . COVID-19 Vaccine (1)- Team please get dates of his covid vaccines for pifizer and input Never done  . COLONOSCOPY - We will call you within two weeks about your referral to colonoscopy. If you do not hear within 3 weeks, give [place] a call.   Never done   We will call you within two weeks about your referral to cardiology. If you do not hear within 3 weeks, give [place] a call.   Team please add TSH if you can to his labs under palpitations.   Try buspirone for anxiety for up to 2 weeks - if not strong enough let me know . Also counseling may be helpful with recent stressors  Please call [phone] to schedule a visit with [facility] -Trey Paula is an excellent counselor who is based out of our clinic  Recommended follow up: Return in about 3 months (around 11/07/2020) for follow up- or sooner if needed. at latest a year for physical

## 2020-08-07 NOTE — Progress Notes (Signed)
Phone [phone] In person visit   Subjective:   Troy Christian is a 64 y.o. year old very pleasant male patient who presents for/with See problem oriented charting Chief Complaint  Patient presents with  . palpatations    patient recently visited [place], prescribed Lorazepam 1 mg while there for anxiety, multiple family members have recently passed from cardiac disease - this has him worried about his own health   This visit occurred during the SARS-CoV-2 public health emergency.  Safety protocols were in place, including screening questions prior to the visit, additional usage of staff PPE, and extensive cleaning of exam room while observing appropriate contact time as indicated for disinfecting solutions.   Past Medical History-  Patient Active Problem List   Diagnosis Date Noted  . Hyperlipidemia     Priority: High  . Spinal stenosis of lumbar region with neurogenic claudication 02/27/2018    Priority: Medium  . BPH (benign prostatic hyperplasia) 01/07/2017    Priority: Medium  . Former smoker 01/07/2017    Priority: Low  . Hx of foreign travel 01/07/2017    Priority: Low  . Elevated blood pressure reading     Priority: Low  . Anxiety 08/07/2020    Medications- reviewed and updated Current Outpatient Medications  Medication Sig Dispense Refill  . LORazepam (ATIVAN) 1 MG tablet Take 1 mg by mouth every 8 (eight) hours.    . busPIRone (BUSPAR) 5 MG tablet Take 1 tablet (5 mg total) by mouth 3 (three) times daily as needed (anxiety). 60 tablet 2   No current facility-administered medications for this visit.     Objective:  BP 138/88   Pulse (!) 58   Wt 183 lb 9.6 oz (83.3 kg)   SpO2 97%   BMI 27.11 kg/m  Gen: NAD, resting comfortably CV: RRR no murmurs rubs or gallops Lungs: CTAB no crackles, wheeze, rhonchi Ext: no edema Skin: warm, dry Neuro: grossly normal, moves all extremities  EKG: sinus rhythm with rate 59-60, normal axis, normal intervals, no hypertrophy,  no st or t wave changes     Assessment and Plan   # Palpitations #Family history of coronary artery disease #Anxiety S:brother died of heart disease at age 56 suddenly as well as 1st cousin on dad's side. Multiple brothers have had high blood pressure issues but he has not. He states he has a fair amount of anxiety. About 20 years ago had similar and he wore a heart monitor for a month and he states nothing was detected. Went to physician in [place] and was told EKG normal- given lorazepam 1 mg when has palpitations- it resolves the symptoms- usually takes 3 days in a row. 28 tablets lasted a year.   Recently he has a lot of stressors and dealing with grieving- stress is much higher. A lot of stress with taxes/IRS, etc.   Palpitations 50% of the day in the last month- almost everyday but does miss osme days.  No other symptoms such as chest pain, shortness of breath, left arm or neck pain  Orthostatic vital signs negative today A/P: 64 year old male with palpitations over the last month starting after loss of his brother at age 36 to heart disease.  EKG reassuring today.  Patient was not having palpitations at time of EKG-we will get 10-day heart monitor.  Patient was coming by for BMP today-we will see if we can add a TSH  Due to family history patient strongly requests referral to cardiology-EKG was reassuring today but  he would like to move forward with this requested-referral was placed  Patient also reports prior work-up with cardiac monitoring sounds like an [place]-reportedly was started on alprazolam for anxiety and has used 26 pills over the last year-recommended trial of buspirone instead.  Actually prior to that recommended counseling patient declines  #Elevated blood pressure reading S: medication: None BP Readings from Last 3 Encounters:  08/07/20 138/88  07/06/20 110/62  06/09/19 127/80  A/P: Initial blood pressure elevated above 140-was high normal on repeat.  I  think stress/anxiety playing a role.  Requested to do some home monitoring  #hyperlipidemia S: Medication: None Lab Results  Component Value Date   CHOL 208 (H) 07/06/2020   HDL 46 07/06/2020   LDLCALC 129 (H) 07/06/2020   TRIG 191 (H) 07/06/2020   CHOLHDL 4.5 07/06/2020   A/P: Only risk factor outside of family history is untreated hyperlipidemia or heart disease-10-year ASCVD risk of 10.5%-patient had preferred to work on diet and exercise but if any cardiac disease found could change plan  Recommended follow up: Return in about 3 months (around 11/07/2020) for follow up- or sooner if needed.   Lab/Order associations:   ICD-10-CM   1. Palpitation  R00.2 EKG 12-Lead    Ambulatory referral to Cardiology    Cardiac event monitor    TSH  2. Hyperlipidemia, unspecified hyperlipidemia type  E78.5   3. Anxiety  F41.9   4. Family history of coronary artery disease  Z82.49 Ambulatory referral to Cardiology  5. Screen for colon cancer  Z12.11 Ambulatory referral to Gastroenterology    Meds ordered this encounter  Medications  . busPIRone (BUSPAR) 5 MG tablet    Sig: Take 1 tablet (5 mg total) by mouth 3 (three) times daily as needed (anxiety).    Dispense:  60 tablet    Refill:  2   Return precautions advised.  Garret Reddish, MD

## 2020-08-08 LAB — BASIC METABOLIC PANEL
BUN: 12 mg/dL (ref 7–25)
CO2: 27 mmol/L (ref 20–32)
Calcium: 9.8 mg/dL (ref 8.6–10.3)
Chloride: 100 mmol/L (ref 98–110)
Creat: 1.2 mg/dL (ref 0.70–1.25)
Glucose, Bld: 86 mg/dL (ref 65–99)
Potassium: 4.3 mmol/L (ref 3.5–5.3)
Sodium: 135 mmol/L (ref 135–146)

## 2020-08-08 LAB — TEST AUTHORIZATION

## 2020-08-08 LAB — TSH: TSH: 0.97 mIU/L (ref 0.40–4.50)

## 2020-08-09 LAB — URINE CYTOLOGY ANCILLARY ONLY
Chlamydia: NEGATIVE
Comment: NEGATIVE
Comment: NEGATIVE
Comment: NORMAL
Neisseria Gonorrhea: NEGATIVE
Trichomonas: NEGATIVE

## 2020-08-11 ENCOUNTER — Other Ambulatory Visit: Payer: Self-pay

## 2020-08-11 ENCOUNTER — Ambulatory Visit (INDEPENDENT_AMBULATORY_CARE_PROVIDER_SITE_OTHER): Payer: Federal, State, Local not specified - PPO | Admitting: Internal Medicine

## 2020-08-11 ENCOUNTER — Encounter: Payer: Self-pay | Admitting: Internal Medicine

## 2020-08-11 VITALS — BP 110/80 | HR 72 | Ht 69.0 in | Wt 186.0 lb

## 2020-08-11 DIAGNOSIS — E782 Mixed hyperlipidemia: Secondary | ICD-10-CM

## 2020-08-11 DIAGNOSIS — F419 Anxiety disorder, unspecified: Secondary | ICD-10-CM | POA: Diagnosis not present

## 2020-08-11 DIAGNOSIS — R002 Palpitations: Secondary | ICD-10-CM | POA: Diagnosis not present

## 2020-08-11 NOTE — Progress Notes (Signed)
Cardiology Office Note:    Date:  08/11/2020   ID:  [name], [name] 02/17/1956, MRN [id number]  PCP:  Marin Olp, MD  Cardiologist:  No primary care provider on file.  Electrophysiologist:  None   Referring MD: Marin Olp, MD   Chief Complaint/Reason for Referral: palpitations  History of Present Illness:    Troy Christian is a 64 y.o. male with a history of elevated BP, anxiety and hyperlipidemia who presents for evaluation of palpitations. 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 He has noticed daily palpitations, and has not been sleeping well. He has been drinking tea throughout the day to get through the day.   He is able to exercise without chest pain or SOB. His brother recently died of heart disease at age 75, and his cousin has had heart disease as well. He notes his brother was a physician but did not take care of himself.   He has been evaluated for palpitations in the past and was given lorazepam 1 mg daily which helps.   Caffeine: tea multiple times a day Alcohol: none Water intake: could improve TSH: normal Herbal supplements/diet products: herbs from his garden but no herbal supplements.  Syncope/presyncope: none.  The patient denies chest pain, chest pressure, dyspnea at rest or with exertion, PND, orthopnea, or leg swelling. Denies cough, fever, chills. Denies nausea, vomiting. Denies syncope or presyncope. Denies dizziness or lightheadedness.  Past Medical History:  Diagnosis Date  . Elevated blood pressure reading    states up with stress.   . Hyperlipidemia    no rx    Past Surgical History:  Procedure Laterality Date  . MOUTH SURGERY    . none      Current Medications: Current Meds  Medication Sig  . busPIRone (BUSPAR) 5 MG tablet Take 1 tablet (5 mg total) by mouth 3 (three) times daily as needed (anxiety).  . LORazepam (ATIVAN) 1 MG tablet Take 1 mg by mouth every 8 (eight) hours.       Allergies:   Penicillins   Social History   Tobacco Use  . Smoking status: Former Smoker    Packs/day: 0.40    Years: 20.00    Pack years: 8.00    Types: Pipe, Cigars    Quit date: 10/14/2008    Years since quitting: 11.8  . Smokeless tobacco: Never Used  Substance Use Topics  . Alcohol use: Yes    Comment: occasionally  . Drug use: No     Family History: The patient's family history includes Asthma in his sister; CAD in his brother; Hypertension in his father and mother; Pneumonia in his mother; Prostate cancer in his brother and father.  ROS:   Please see the history of present illness.    All other systems reviewed and are negative.  EKGs/Labs/Other Studies Reviewed:    The following studies were reviewed today:  EKG:  NSR  Recent Labs: 07/06/2020: ALT 16; Hemoglobin 15.5; Platelets 248 08/07/2020: BUN 12; Creat 1.20; Potassium 4.3; Sodium 135; TSH 0.97  Recent Lipid Panel    Component Value Date/Time   CHOL 208 (H) 07/06/2020 1622   TRIG 191 (H) 07/06/2020 1622   HDL 46 07/06/2020 1622   CHOLHDL 4.5 07/06/2020 1622   VLDL 19.8 01/07/2017 1157   LDLCALC 129 (H) 07/06/2020 1622    Physical Exam:    VS:  BP 110/80   Pulse 72   Ht  5\' 9"  (1.753 m)   Wt 186 lb (84.4 kg)   SpO2 98%   BMI 27.47 kg/m     Wt Readings from Last 5 Encounters:  08/11/20 186 lb (84.4 kg)  08/07/20 183 lb 9.6 oz (83.3 kg)  07/06/20 185 lb 3.2 oz (84 kg)  06/09/19 184 lb (83.5 kg)  05/06/19 184 lb (83.5 kg)    Constitutional: No acute distress Eyes: sclera non-icteric, normal conjunctiva and lids ENMT: normal dentition, moist mucous membranes Cardiovascular: regular rhythm, normal rate, no murmurs. S1 and S2 normal. Radial pulses normal bilaterally. No jugular venous distention.  Respiratory: clear to auscultation bilaterally GI : normal bowel sounds, soft and nontender. No distention.   MSK: extremities warm, well perfused. No edema.  NEURO: grossly nonfocal exam, moves all  extremities. PSYCH: alert and oriented x 3, normal mood and affect.   ASSESSMENT:    1. Palpitations   2. Mixed hyperlipidemia   3. Anxiety    PLAN:    Palpitations - Plan: EKG 12-Lead, ECHOCARDIOGRAM COMPLETE  We discussed eliminating or reducing caffeine intake and monitoring symptoms. Dr. Yong Channel has ordered a cardiac monitor, I agree. I would also like to obtain an echocardiogram to exclude structural causes contributing to palpitations. He will need to pursue stress reduction techniques to help manage the anxiety component contributing to his symptoms.   Mixed hyperlipidemia - not currently on therapy. Would recommend aggressive diet and lifestyle modification, and if no improvement, would start Crestor 5 mg daily. Consider coronary artery calcium scoring if patient undecided about cholesterol therapy.    Cherlynn Kaiser, MD [place]  [facility]    Medication Adjustments/Labs and Tests Ordered: Current medicines are reviewed at length with the patient today.  Concerns regarding medicines are outlined above.   Orders Placed This Encounter  Procedures  . EKG 12-Lead  . ECHOCARDIOGRAM COMPLETE    No orders of the defined types were placed in this encounter.   Patient Instructions  Medication Instructions:  No Changes In Medications at this time.  *If you need a refill on your cardiac medications before your next appointment, please call your pharmacy*  Lab Work: None Ordered At This Time.  If you have labs (blood work) drawn today and your tests are completely normal, you will receive your results only by: Marland Kitchen MyChart Message (if you have MyChart) OR . A paper copy in the mail If you have any lab test that is abnormal or we need to change your treatment, we will call you to review the results.  Testing/Procedures: Your physician has requested that you have an echocardiogram. Echocardiography is a painless test that uses sound waves to create images of your  heart. It provides your doctor with information about the size and shape of your heart and how well your heart's chambers and valves are working. You may receive an ultrasound enhancing agent through an IV if needed to better visualize your heart during the echo.This procedure takes approximately one hour. There are no restrictions for this procedure. This will take place at the [address].   Follow-Up: At [facility], you and your health needs are our priority.  As part of our continuing mission to provide you with exceptional heart care, we have created designated Provider Care Teams.  These Care Teams include your primary Cardiologist (physician) and Advanced Practice Providers (APPs -  Physician Assistants and Nurse Practitioners) who all work together to provide you with the care you need, when  you need it.  Your next appointment:   November 22nd at 2:00pm   The format for your next appointment:   In Person  Provider:   Cherlynn Kaiser, MD

## 2020-08-11 NOTE — Patient Instructions (Signed)
Medication Instructions:  No Changes In Medications at this time.  *If you need a refill on your cardiac medications before your next appointment, please call your pharmacy*  Lab Work: None Ordered At This Time.  If you have labs (blood work) drawn today and your tests are completely normal, you will receive your results only by: Marland Kitchen MyChart Message (if you have MyChart) OR . A paper copy in the mail If you have any lab test that is abnormal or we need to change your treatment, we will call you to review the results.  Testing/Procedures: Your physician has requested that you have an echocardiogram. Echocardiography is a painless test that uses sound waves to create images of your heart. It provides your doctor with information about the size and shape of your heart and how well your heart's chambers and valves are working. You may receive an ultrasound enhancing agent through an IV if needed to better visualize your heart during the echo.This procedure takes approximately one hour. There are no restrictions for this procedure. This will take place at the [address].   Follow-Up: At [facility], you and your health needs are our priority.  As part of our continuing mission to provide you with exceptional heart care, we have created designated Provider Care Teams.  These Care Teams include your primary Cardiologist (physician) and Advanced Practice Providers (APPs -  Physician Assistants and Nurse Practitioners) who all work together to provide you with the care you need, when you need it.  Your next appointment:   November 22nd at 2:00pm   The format for your next appointment:   In Person  Provider:   Cherlynn Kaiser, MD

## 2020-08-15 ENCOUNTER — Encounter (INDEPENDENT_AMBULATORY_CARE_PROVIDER_SITE_OTHER): Payer: Federal, State, Local not specified - PPO

## 2020-08-15 ENCOUNTER — Telehealth: Payer: Self-pay | Admitting: *Deleted

## 2020-08-15 DIAGNOSIS — R002 Palpitations: Secondary | ICD-10-CM | POA: Diagnosis not present

## 2020-08-15 NOTE — Telephone Encounter (Signed)
    Pt said she will go to [place] today, he needs help to put his heart monitor

## 2020-09-01 ENCOUNTER — Ambulatory Visit (HOSPITAL_COMMUNITY): Payer: Federal, State, Local not specified - PPO | Attending: Internal Medicine

## 2020-09-01 ENCOUNTER — Other Ambulatory Visit: Payer: Self-pay

## 2020-09-01 DIAGNOSIS — R002 Palpitations: Secondary | ICD-10-CM

## 2020-09-01 LAB — ECHOCARDIOGRAM COMPLETE
Area-P 1/2: 3.85 cm2
S' Lateral: 2.4 cm

## 2020-09-04 ENCOUNTER — Other Ambulatory Visit: Payer: Self-pay

## 2020-09-04 ENCOUNTER — Ambulatory Visit: Payer: Federal, State, Local not specified - PPO | Admitting: Internal Medicine

## 2020-09-04 ENCOUNTER — Encounter: Payer: Self-pay | Admitting: Internal Medicine

## 2020-09-04 VITALS — BP 122/90 | HR 71 | Ht 69.5 in | Wt 189.9 lb

## 2020-09-04 DIAGNOSIS — R002 Palpitations: Secondary | ICD-10-CM | POA: Diagnosis not present

## 2020-09-04 DIAGNOSIS — E782 Mixed hyperlipidemia: Secondary | ICD-10-CM

## 2020-09-04 NOTE — Patient Instructions (Signed)
Medication Instructions:  No Changes In Medications at this time.  *If you need a refill on your cardiac medications before your next appointment, please call your pharmacy*  Lab Work: FASTING LIPID PANEL PRIOR TO 6 MONTH APPOINTMENT  If you have labs (blood work) drawn today and your tests are completely normal, you will receive your results only by: Marland Kitchen MyChart Message (if you have MyChart) OR . A paper copy in the mail If you have any lab test that is abnormal or we need to change your treatment, we will call you to review the results.  Follow-Up: At [facility], you and your health needs are our priority.  As part of our continuing mission to provide you with exceptional heart care, we have created designated Provider Care Teams.  These Care Teams include your primary Cardiologist (physician) and Advanced Practice Providers (APPs -  Physician Assistants and Nurse Practitioners) who all work together to provide you with the care you need, when you need it.  Your next appointment:   6 month(s)  The format for your next appointment:   In Person  Provider:   Cherlynn Kaiser, MD

## 2020-09-11 ENCOUNTER — Other Ambulatory Visit: Payer: Federal, State, Local not specified - PPO

## 2020-11-11 NOTE — Progress Notes (Signed)
Cardiology Office Note:    Date:  09/04/2021   ID:  Troy Christian, Troy Christian 1956-02-15, MRN [id number]  PCP:  Marin Olp, MD  Cardiologist:  No primary care provider on file.  Electrophysiologist:  None   Referring MD: Marin Olp, MD   Chief Complaint/Reason for Referral: palpitations  History of Present Illness:    Troy Christian is a 65 y.o. male with a history of elevated BP, anxiety and hyperlipidemia who presents for follow up evaluation of palpitations.   He is feeling much better after reducing caffeine intake and increasing hydration. He things this was the main issue. Monitor results reassuring, no afib and infrequent ectopy. No diary recordings.   Discussed prevention of CAD given his brother's history of heart disease. Lipids elevated at last check in September. He has tried to eat well. ASCVD risk score is 8.5%, intermediate risk. No CP or SOB. No syncope.   Past Medical History:  Diagnosis Date  . Elevated blood pressure reading    states up with stress.   . Hyperlipidemia    no rx    Past Surgical History:  Procedure Laterality Date  . MOUTH SURGERY    . none      Current Medications: No outpatient medications have been marked as taking for the 09/04/20 encounter (Office Visit) with Elouise Munroe, MD.     Allergies:   Penicillins   Social History   Tobacco Use  . Smoking status: Former Smoker    Packs/day: 0.40    Years: 20.00    Pack years: 8.00    Types: Pipe, Cigars    Quit date: 10/14/2008    Years since quitting: 12.0  . Smokeless tobacco: Never Used  Substance Use Topics  . Alcohol use: Yes    Comment: occasionally  . Drug use: No     Family History: The patient's family history includes Asthma in his sister; CAD in his brother; Hypertension in his father and mother; Pneumonia in his mother; Prostate cancer in his brother and father.  ROS:   Please see the history of present illness.    All other systems reviewed and are  negative.  EKGs/Labs/Other Studies Reviewed:    The following studies were reviewed today:  Recent Labs: 07/06/2020: ALT 16; Hemoglobin 15.5; Platelets 248 08/07/2020: BUN 12; Creat 1.20; Potassium 4.3; Sodium 135; TSH 0.97  Recent Lipid Panel    Component Value Date/Time   CHOL 208 (H) 07/06/2020 1622   TRIG 191 (H) 07/06/2020 1622   HDL 46 07/06/2020 1622   CHOLHDL 4.5 07/06/2020 1622   VLDL 19.8 01/07/2017 1157   LDLCALC 129 (H) 07/06/2020 1622    Physical Exam:    VS:  BP 122/90 (BP Location: Left Arm, Patient Position: Sitting)   Pulse 71   Ht 5' 9.5" (1.765 m)   Wt 189 lb 14.4 oz (86.1 kg)   SpO2 99%   BMI 27.64 kg/m     Wt Readings from Last 5 Encounters:  09/04/20 189 lb 14.4 oz (86.1 kg)  08/11/20 186 lb (84.4 kg)  08/07/20 183 lb 9.6 oz (83.3 kg)  07/06/20 185 lb 3.2 oz (84 kg)  06/09/19 184 lb (83.5 kg)    Constitutional: No acute distress Eyes: sclera non-icteric, normal conjunctiva and lids ENMT: normal dentition, moist mucous membranes Cardiovascular: regular rhythm, normal rate, no murmurs. S1 and S2 normal. Radial pulses normal bilaterally. No jugular venous distention.  Respiratory: clear to auscultation bilaterally GI : normal bowel  sounds, soft and nontender. No distention.   MSK: extremities warm, well perfused. No edema.  NEURO: grossly nonfocal exam, moves all extremities. PSYCH: alert and oriented x 3, normal mood and affect.   ASSESSMENT:    1. Palpitations   2. Mixed hyperlipidemia    PLAN:    Palpitations - improved with reduction of caffeine. Continue conservative measures and adequate hydration. Can provide low dose BB if needed for future symptoms, deferred today.   Mixed hyperlipidemia - Plan: Lipid panel - LDL elevated with intermediate risk ASCVD score. Will check lipid panel when he returns from [place]. If concern about starting statin, would consider Ct coronary calcium scoring.   Total time of encounter: 20 minutes total  time of encounter, including 15 minutes spent in face-to-face patient care on the date of this encounter. This time includes coordination of care and counseling regarding above mentioned problem list. Remainder of non-face-to-face time involved reviewing chart documents/testing relevant to the patient encounter and documentation in the medical record. I have independently reviewed documentation from referring provider.   Cherlynn Kaiser, MD [place]  [facility]    Medication Adjustments/Labs and Tests Ordered: Current medicines are reviewed at length with the patient today.  Concerns regarding medicines are outlined above.   Orders Placed This Encounter  Procedures  . Lipid panel   No orders of the defined types were placed in this encounter.   Patient Instructions  Medication Instructions:  No Changes In Medications at this time.  *If you need a refill on your cardiac medications before your next appointment, please call your pharmacy*  Lab Work: FASTING LIPID PANEL PRIOR TO 6 MONTH APPOINTMENT  If you have labs (blood work) drawn today and your tests are completely normal, you will receive your results only by: Marland Kitchen MyChart Message (if you have MyChart) OR . A paper copy in the mail If you have any lab test that is abnormal or we need to change your treatment, we will call you to review the results.  Follow-Up: At [facility], you and your health needs are our priority.  As part of our continuing mission to provide you with exceptional heart care, we have created designated Provider Care Teams.  These Care Teams include your primary Cardiologist (physician) and Advanced Practice Providers (APPs -  Physician Assistants and Nurse Practitioners) who all work together to provide you with the care you need, when you need it.  Your next appointment:   6 month(s)  The format for your next appointment:   In Person  Provider:   Cherlynn Kaiser, MD

## 2020-11-16 NOTE — Patient Instructions (Addendum)
Health Maintenance Due  Topic Date Due  . COLONOSCOPY (Pts 45-52yrs Insurance coverage will need to be confirmed) referred today. Check mychart tomorrow or call  Phone: [phone] Never done   Schedule a lab visit at the check out desk in early march or late february. Return for future fasting labs meaning nothing but water after midnight please. Ok to take your medications with water.   I refilled atorvastatin with 90 day supply. We are hoping for at least a 30-50% reduction. Ideally bad cholesterol would be below 70 for most aggressive approach.   Recommended follow up: Return in about 8 months (around 07/17/2021) for physical or sooner if needed.

## 2020-11-16 NOTE — Progress Notes (Signed)
Phone [phone] In person visit   Subjective:   Troy Christian is a 65 y.o. year old very pleasant male patient who presents for/with See problem oriented charting Chief Complaint  Patient presents with  . Anxiety    Patient states that he thinks his anxiety is gone, he was just drinking too much coffee.   . Hyperlipidemia   This visit occurred during the SARS-CoV-2 public health emergency.  Safety protocols were in place, including screening questions prior to the visit, additional usage of staff PPE, and extensive cleaning of exam room while observing appropriate contact time as indicated for disinfecting solutions.   Past Medical History-  Patient Active Problem List   Diagnosis Date Noted  . Hyperlipidemia     Priority: High  . Spinal stenosis of lumbar region with neurogenic claudication 02/27/2018    Priority: Medium  . BPH (benign prostatic hyperplasia) 01/07/2017    Priority: Medium  . Former smoker 01/07/2017    Priority: Low  . Hx of foreign travel 01/07/2017    Priority: Low  . Elevated blood pressure reading     Priority: Low  . Anxiety 08/07/2020    Medications- reviewed and updated Current Outpatient Medications  Medication Sig Dispense Refill  . atorvastatin (LIPITOR) 20 MG tablet Take 1 tablet (20 mg total) by mouth daily. 90 tablet 3   No current facility-administered medications for this visit.     Objective:  BP 126/84   Pulse 73   Temp 99 F (37.2 C) (Temporal)   Ht 5\' 10"  (1.778 m)   Wt 185 lb 3.2 oz (84 kg)   SpO2 96%   BMI 26.57 kg/m  Gen: NAD, resting comfortably CV: RRR no murmurs rubs or gallops Lungs: CTAB no crackles, wheeze, rhonchi Ext: no edema Skin: warm, dry     Assessment and Plan   #hyperlipidemia S: Medication:atorvastatin 20 mg for about 20 days started in [place] by GP there. 1st 2 days had some joint pain but that has improved.    Has been trying to eat a healthy diet, exercising and #s were still high Lab  Results  Component Value Date   CHOL 208 (H) 07/06/2020   HDL 46 07/06/2020   LDLCALC 129 (H) 07/06/2020   TRIG 191 (H) 07/06/2020   CHOLHDL 4.5 07/06/2020   A/P: poor control at last visit. Saw [place] doctor who recommended atorvastatin 20mg . I refilled atorvastatin with 90 day supply- with 10 year ascvd risk at 8.9% this is a reasonable approach. We are hoping for at least a 30-50% reduction. Ideally bad cholesterol would be below 70 for most aggressive approach.   # Anxiety S:Medication: Ativan 1Mg  previously prescribed, Buspar 5MG  previously prescribed- has been able to come off of those  Patient was seen for this and palpitations and ultimately saw cardiology. Event monitor in November and echocardiogram largely reassuring. He had been drinking a lot of caffeine (more than I was aware) of 6 cups of coffee in AM and that was not it for the day.  A/P: anxiety related to high caffeine intake- he is doing much better now- completely off medicine. Palpitations have also resolved.    #refer for colonoscopy again  Recommended follow up: Return in about 8 months (around 07/17/2021) for physical or sooner if needed.  Lab/Order associations:   ICD-10-CM   1. Mixed hyperlipidemia  E78.2 Comprehensive metabolic panel    Lipid panel    CBC with Differential/Platelet  2. Anxiety  F41.9  Meds ordered this encounter  Medications  . atorvastatin (LIPITOR) 20 MG tablet    Sig: Take 1 tablet (20 mg total) by mouth daily.    Dispense:  90 tablet    Refill:  3   Return precautions advised.  Garret Reddish, MD

## 2020-11-17 ENCOUNTER — Other Ambulatory Visit: Payer: Self-pay

## 2020-11-17 ENCOUNTER — Ambulatory Visit: Payer: Federal, State, Local not specified - PPO | Admitting: Family Medicine

## 2020-11-17 ENCOUNTER — Encounter: Payer: Self-pay | Admitting: Family Medicine

## 2020-11-17 VITALS — BP 126/84 | HR 73 | Temp 99.0°F | Ht 70.0 in | Wt 185.2 lb

## 2020-11-17 DIAGNOSIS — E782 Mixed hyperlipidemia: Secondary | ICD-10-CM | POA: Diagnosis not present

## 2020-11-17 DIAGNOSIS — F419 Anxiety disorder, unspecified: Secondary | ICD-10-CM

## 2020-11-17 DIAGNOSIS — Z1211 Encounter for screening for malignant neoplasm of colon: Secondary | ICD-10-CM

## 2020-11-17 MED ORDER — ATORVASTATIN CALCIUM 20 MG PO TABS: 20.0000 mg | ORAL_TABLET | Freq: Every day | ORAL | 3 refills | Status: DC

## 2020-11-27 ENCOUNTER — Other Ambulatory Visit: Payer: Self-pay

## 2020-11-27 ENCOUNTER — Ambulatory Visit (AMBULATORY_SURGERY_CENTER): Payer: Self-pay | Admitting: *Deleted

## 2020-11-27 VITALS — Ht 69.0 in | Wt 184.0 lb

## 2020-11-27 DIAGNOSIS — Z1211 Encounter for screening for malignant neoplasm of colon: Secondary | ICD-10-CM

## 2020-11-27 MED ORDER — PLENVU 140 G PO SOLR: 1.0000 | ORAL | 0 refills | Status: DC

## 2020-11-27 NOTE — Progress Notes (Signed)
No egg or soy allergy known to patient  No issues with past sedation with any surgeries or procedures No intubation problems in the past  No FH of Malignant Hyperthermia No diet pills per patient No home 02 use per patient  No blood thinners per patient  Pt denies issues with constipation  No A fib or A flutter  EMMI video to pt or via [place] 19 guidelines implemented in PV today with Pt and RN  Pt is fully vaccinated  for Covid   Pt denies loose or missing teeth, denies dentures, partials, dental implants, capped or bonded teeth  PLENVU  Coupon given to pt in PV today , Code to Pharmacy and  NO PA's for preps discussed with pt In PV today  Discussed with pt there will be an out-of-pocket cost for prep and that varies from $0 to 70 dollars   Due to the COVID-19 pandemic we are asking patients to follow certain guidelines.  Pt aware of COVID protocols and [facility] guidelines

## 2020-12-12 ENCOUNTER — Other Ambulatory Visit (INDEPENDENT_AMBULATORY_CARE_PROVIDER_SITE_OTHER): Payer: Federal, State, Local not specified - PPO

## 2020-12-12 DIAGNOSIS — E782 Mixed hyperlipidemia: Secondary | ICD-10-CM

## 2020-12-12 HISTORY — PX: COLONOSCOPY: SHX174

## 2020-12-12 LAB — COMPREHENSIVE METABOLIC PANEL
ALT: 17 U/L (ref 0–53)
AST: 17 U/L (ref 0–37)
Albumin: 4.3 g/dL (ref 3.5–5.2)
Alkaline Phosphatase: 85 U/L (ref 39–117)
BUN: 8 mg/dL (ref 6–23)
CO2: 29 mEq/L (ref 19–32)
Calcium: 9.2 mg/dL (ref 8.4–10.5)
Chloride: 104 mEq/L (ref 96–112)
Creatinine, Ser: 1.13 mg/dL (ref 0.40–1.50)
GFR: 69.63 mL/min (ref >60.00)
Glucose, Bld: 90 mg/dL (ref 70–99)
Potassium: 3.9 mEq/L (ref 3.5–5.1)
Sodium: 138 mEq/L (ref 135–145)
Total Bilirubin: 0.9 mg/dL (ref 0.2–1.2)
Total Protein: 6.8 g/dL (ref 6.0–8.3)

## 2020-12-12 LAB — LIPID PANEL
Cholesterol: 113 mg/dL (ref 0–200)
HDL: 42.4 mg/dL (ref >39.00)
LDL Cholesterol: 55 mg/dL (ref 0–99)
NonHDL: 70.14
Total CHOL/HDL Ratio: 3
Triglycerides: 77 mg/dL (ref 0.0–149.0)
VLDL: 15.4 mg/dL (ref 0.0–40.0)

## 2020-12-12 LAB — CBC WITH DIFFERENTIAL/PLATELET
Basophils Absolute: 0 10*3/uL (ref 0.0–0.1)
Basophils Relative: 0.5 % (ref 0.0–3.0)
Eosinophils Absolute: 0 10*3/uL (ref 0.0–0.7)
Eosinophils Relative: 0.2 % (ref 0.0–5.0)
HCT: 44.5 % (ref 39.0–52.0)
Hemoglobin: 14.8 g/dL (ref 13.0–17.0)
Lymphocytes Relative: 39 % (ref 12.0–46.0)
Lymphs Abs: 1.2 10*3/uL (ref 0.7–4.0)
MCHC: 33.3 g/dL (ref 30.0–36.0)
MCV: 93.1 fl (ref 78.0–100.0)
Monocytes Absolute: 0.3 10*3/uL (ref 0.1–1.0)
Monocytes Relative: 10.9 % (ref 3.0–12.0)
Neutro Abs: 1.5 10*3/uL (ref 1.4–7.7)
Neutrophils Relative %: 49.4 % (ref 43.0–77.0)
Platelets: 248 10*3/uL (ref 150.0–400.0)
RBC: 4.79 Mil/uL (ref 4.22–5.81)
RDW: 13.8 % (ref 11.5–15.5)
WBC: 3.1 10*3/uL — ABNORMAL LOW (ref 4.0–10.5)

## 2020-12-20 ENCOUNTER — Encounter: Payer: Self-pay | Admitting: Gastroenterology

## 2020-12-20 ENCOUNTER — Other Ambulatory Visit: Payer: Self-pay

## 2020-12-20 ENCOUNTER — Ambulatory Visit (AMBULATORY_SURGERY_CENTER): Payer: Federal, State, Local not specified - PPO | Admitting: Gastroenterology

## 2020-12-20 VITALS — BP 103/69 | HR 60 | Temp 94.4°F | Resp 13 | Ht 69.0 in | Wt 184.0 lb

## 2020-12-20 DIAGNOSIS — D122 Benign neoplasm of ascending colon: Secondary | ICD-10-CM

## 2020-12-20 DIAGNOSIS — Z1211 Encounter for screening for malignant neoplasm of colon: Secondary | ICD-10-CM | POA: Diagnosis not present

## 2020-12-20 DIAGNOSIS — D123 Benign neoplasm of transverse colon: Secondary | ICD-10-CM | POA: Diagnosis not present

## 2020-12-20 DIAGNOSIS — D125 Benign neoplasm of sigmoid colon: Secondary | ICD-10-CM | POA: Diagnosis not present

## 2020-12-20 DIAGNOSIS — D124 Benign neoplasm of descending colon: Secondary | ICD-10-CM

## 2020-12-20 MED ORDER — SODIUM CHLORIDE 0.9 % IV SOLN: 500.0000 mL | Freq: Once | INTRAVENOUS | Status: DC

## 2020-12-20 NOTE — Progress Notes (Signed)
Vitals by [place] 

## 2020-12-20 NOTE — Patient Instructions (Signed)
Handout given:  Polyps Resume previous diet Continue current medications Await pathology results  YOU HAD AN ENDOSCOPIC PROCEDURE TODAY AT THE [facility]:   Refer to the procedure report that was given to you for any specific questions about what was found during the examination.  If the procedure report does not answer your questions, please call your gastroenterologist to clarify.  If you requested that your care partner not be given the details of your procedure findings, then the procedure report has been included in a sealed envelope for you to review at your convenience later.  YOU SHOULD EXPECT: Some feelings of bloating in the abdomen. Passage of more gas than usual.  Walking can help get rid of the air that was put into your GI tract during the procedure and reduce the bloating. If you had a lower endoscopy (such as a colonoscopy or flexible sigmoidoscopy) you may notice spotting of blood in your stool or on the toilet paper. If you underwent a bowel prep for your procedure, you may not have a normal bowel movement for a few days.  Please Note:  You might notice some irritation and congestion in your nose or some drainage.  This is from the oxygen used during your procedure.  There is no need for concern and it should clear up in a day or so.  SYMPTOMS TO REPORT IMMEDIATELY:   Following lower endoscopy (colonoscopy or flexible sigmoidoscopy):  Excessive amounts of blood in the stool  Significant tenderness or worsening of abdominal pains  Swelling of the abdomen that is new, acute  Fever of 100F or higher  For urgent or emergent issues, a gastroenterologist can be reached at any hour by calling [phone]. Do not use MyChart messaging for urgent concerns.    DIET:  We do recommend a small meal at first, but then you may proceed to your regular diet.  Drink plenty of fluids but you should avoid alcoholic beverages for 24 hours.  ACTIVITY:  You should plan to take  it easy for the rest of today and you should NOT DRIVE or use heavy machinery until tomorrow (because of the sedation medicines used during the test).    FOLLOW UP: Our staff will call the number listed on your records 48-72 hours following your procedure to check on you and address any questions or concerns that you may have regarding the information given to you following your procedure. If we do not reach you, we will leave a message.  We will attempt to reach you two times.  During this call, we will ask if you have developed any symptoms of COVID 19. If you develop any symptoms (ie: fever, flu-like symptoms, shortness of breath, cough etc.) before then, please call [phone].  If you test positive for Covid 19 in the 2 weeks post procedure, please call and report this information to us.    If any biopsies were taken you will be contacted by phone or by letter within the next 1-3 weeks.  Please call us at [phone] if you have not heard about the biopsies in 3 weeks.   SIGNATURES/CONFIDENTIALITY: You and/or your care partner have signed paperwork which will be entered into your electronic medical record.  These signatures attest to the fact that that the information above on your After Visit Summary has been reviewed and is understood.  Full responsibility of the confidentiality of this discharge information lies with you and/or your care-partner. 

## 2020-12-20 NOTE — Op Note (Signed)
[facility] Patient Name: Troy Christian Procedure Date: 12/20/2020 2:25 PM MRN: [id number] Endoscopist: Thornton Park MD, MD Age: 65 Referring MD:  Date of Birth: 12-Sep-1956 Gender: Male Account #: [id number] Procedure:                Colonoscopy Indications:              Screening for colorectal malignant neoplasm, This                            is the patient's first colonoscopy                           No known family history of colon cancer or polyps Medicines:                Monitored Anesthesia Care Procedure:                Pre-Anesthesia Assessment:                           - Prior to the procedure, a History and Physical                            was performed, and patient medications and                            allergies were reviewed. The patient's tolerance of                            previous anesthesia was also reviewed. The risks                            and benefits of the procedure and the sedation                            options and risks were discussed with the patient.                            All questions were answered, and informed consent                            was obtained. Prior Anticoagulants: The patient has                            taken no previous anticoagulant or antiplatelet                            agents. ASA Grade Assessment: II - A patient with                            mild systemic disease. After reviewing the risks                            and benefits, the patient was deemed in  satisfactory condition to undergo the procedure.                           After obtaining informed consent, the colonoscope                            was passed under direct vision. Throughout the                            procedure, the patient's blood pressure, pulse, and                            oxygen saturations were monitored continuously. The                            Olympus [id number])  Colonoscope was                            introduced through the anus and advanced to the 3                            cm into the ileum. A second forward view of the                            right colon was performed. The colonoscopy was                            performed without difficulty. The patient tolerated                            the procedure well. The quality of the bowel                            preparation was good. The terminal ileum, ileocecal                            valve, appendiceal orifice, and rectum were                            photographed. Scope In: 2:35:42 PM Scope Out: 2:50:15 PM Scope Withdrawal Time: 0 hours 11 minutes 31 seconds  Total Procedure Duration: 0 hours 14 minutes 33 seconds  Findings:                 The perianal and digital rectal examinations were                            normal.                           Non-bleeding internal hemorrhoids were found.                           Seven sessile polyps were found in the sigmoid  colon (2), descending colon (1), transverse colon                            (1), hepatic flexure (1) and ascending colon (2).                            The polyps were 2 to 3 mm in size. These polyps                            were removed with a cold snare. Resection and                            retrieval were complete. Estimated blood loss was                            minimal.                           The exam was otherwise without abnormality on                            direct and retroflexion views. Complications:            No immediate complications. Estimated blood loss:                            Minimal. Estimated Blood Loss:     Estimated blood loss was minimal. Impression:               - Non-bleeding internal hemorrhoids.                           - Seven 2 to 3 mm polyps in the sigmoid colon, in                            the descending colon, in the transverse  colon, at                            the hepatic flexure and in the ascending colon,                            removed with a cold snare. Resected and retrieved.                           - The examination was otherwise normal on direct                            and retroflexion views. Recommendation:           - Patient has a contact number available for                            emergencies. The signs and symptoms of potential  delayed complications were discussed with the                            patient. Return to normal activities tomorrow.                            Written discharge instructions were provided to the                            patient.                           - Resume previous diet.                           - Continue present medications.                           - Await pathology results.                           - Repeat colonoscopy date to be determined after                            pending pathology results are reviewed for                            surveillance.                           - Emerging evidence supports eating a diet of                            fruits, vegetables, grains, calcium, and yogurt                            while reducing red meat and alcohol may reduce the                            risk of colon cancer.                           - Thank you for allowing me to be involved in your                            colon cancer prevention. Thornton Park MD, MD 12/20/2020 2:55:59 PM This report has been signed electronically.

## 2020-12-20 NOTE — Progress Notes (Signed)
Called to room to assist during endoscopic procedure.  Patient ID and intended procedure confirmed with present staff. Received instructions for my participation in the procedure from the performing physician.  

## 2020-12-20 NOTE — Progress Notes (Signed)
Report to PACU, RN, vss, BBS= Clear.  

## 2020-12-21 ENCOUNTER — Telehealth: Payer: Self-pay

## 2020-12-21 NOTE — Telephone Encounter (Signed)
Ideal bad cholesterol is under 70 and his at 55-I strongly suspect if we go down to 10 mg he will be above this ideal goal-if he still would like to make the reduction you may change to 10 mg daily #90 with 3 refills of atorvastatin

## 2020-12-21 NOTE — Telephone Encounter (Signed)
See below

## 2020-12-21 NOTE — Telephone Encounter (Signed)
Pt states his cholesterol has come down drastically and that maybe he should lower his Lipitor to 10 mg. [place] and requesting a 90 day supply of the Lipitor.

## 2020-12-22 ENCOUNTER — Telehealth: Payer: Self-pay

## 2020-12-22 ENCOUNTER — Other Ambulatory Visit: Payer: Self-pay

## 2020-12-22 MED ORDER — ATORVASTATIN CALCIUM 10 MG PO TABS: 10.0000 mg | ORAL_TABLET | Freq: Every day | ORAL | 3 refills | Status: DC

## 2020-12-22 MED ORDER — ROSUVASTATIN CALCIUM 10 MG PO TABS: 10.0000 mg | ORAL_TABLET | Freq: Every day | ORAL | 3 refills | Status: DC

## 2020-12-22 NOTE — Telephone Encounter (Signed)
Received a medication change request from the pharmacy due to patient having muscle pain due to the atorvastatin. Dr Yong Channel discontinued the atorvastatin and started the patient on rosuvastatin 10mg  daily.

## 2020-12-22 NOTE — Telephone Encounter (Signed)
Patient is agreeable with taking the Atorvastatin at a lower dosage. Medication has been changed and refilled Per Dr. Ronney Lion comments.

## 2020-12-22 NOTE — Telephone Encounter (Signed)
I changed the medication from Atorvastatin to Rosuvastatin today per Dr Yong Channel. But when the patient declined the change I had to change the medication back to Atorvastatin but the lower dosage.

## 2020-12-22 NOTE — Telephone Encounter (Signed)
  Follow up Call-  Call back number 12/20/2020  Post procedure Call Back phone  # [phone]  Permission to leave phone message Yes  Some recent data might be hidden     Patient questions:  Do you have a fever, pain , or abdominal swelling? No. Pain Score  0 *  Have you tolerated food without any problems? Yes.    Have you been able to return to your normal activities? Yes.    Do you have any questions about your discharge instructions: Diet   No. Medications  No. Follow up visit  No.  Do you have questions or concerns about your Care? No.  Actions: * If pain score is 4 or above: No action needed, pain <4.  1. Have you developed a fever since your procedure? no  2.   Have you had an respiratory symptoms (SOB or cough) since your procedure? no  3.   Have you tested positive for COVID 19 since your procedure no  4.   Have you had any family members/close contacts diagnosed with the COVID 19 since your procedure?  no   If yes to any of these questions please route to Joylene John, RN and Joella Prince, RN

## 2020-12-28 ENCOUNTER — Encounter: Payer: Self-pay | Admitting: Family Medicine

## 2020-12-28 ENCOUNTER — Ambulatory Visit: Payer: Federal, State, Local not specified - PPO | Attending: Critical Care Medicine

## 2020-12-28 DIAGNOSIS — Z20822 Contact with and (suspected) exposure to covid-19: Secondary | ICD-10-CM

## 2020-12-29 LAB — NOVEL CORONAVIRUS, NAA: SARS-CoV-2, NAA: NOT DETECTED

## 2020-12-29 LAB — SARS-COV-2, NAA 2 DAY TAT

## 2020-12-30 ENCOUNTER — Encounter: Payer: Self-pay | Admitting: Family Medicine

## 2021-01-01 LAB — TIQ-NTM

## 2021-01-01 LAB — TSH

## 2021-01-08 ENCOUNTER — Encounter: Payer: Self-pay | Admitting: Gastroenterology

## 2021-08-21 ENCOUNTER — Encounter: Payer: Self-pay | Admitting: Family Medicine

## 2021-08-21 ENCOUNTER — Other Ambulatory Visit: Payer: Self-pay

## 2021-08-21 ENCOUNTER — Ambulatory Visit: Payer: Federal, State, Local not specified - PPO | Admitting: Family Medicine

## 2021-08-21 VITALS — BP 118/84 | HR 71 | Temp 98.5°F | Ht 69.0 in | Wt 180.8 lb

## 2021-08-21 DIAGNOSIS — A0100 Typhoid fever, unspecified: Secondary | ICD-10-CM | POA: Diagnosis not present

## 2021-08-21 DIAGNOSIS — E782 Mixed hyperlipidemia: Secondary | ICD-10-CM

## 2021-08-21 DIAGNOSIS — Z23 Encounter for immunization: Secondary | ICD-10-CM | POA: Diagnosis not present

## 2021-08-21 DIAGNOSIS — F419 Anxiety disorder, unspecified: Secondary | ICD-10-CM

## 2021-08-21 DIAGNOSIS — Z125 Encounter for screening for malignant neoplasm of prostate: Secondary | ICD-10-CM | POA: Diagnosis not present

## 2021-08-21 DIAGNOSIS — Z114 Encounter for screening for human immunodeficiency virus [HIV]: Secondary | ICD-10-CM | POA: Diagnosis not present

## 2021-08-21 DIAGNOSIS — M79605 Pain in left leg: Secondary | ICD-10-CM

## 2021-08-21 DIAGNOSIS — Z113 Encounter for screening for infections with a predominantly sexual mode of transmission: Secondary | ICD-10-CM | POA: Diagnosis not present

## 2021-08-21 LAB — COMPREHENSIVE METABOLIC PANEL
ALT: 15 U/L (ref 0–53)
AST: 18 U/L (ref 0–37)
Albumin: 4.6 g/dL (ref 3.5–5.2)
Alkaline Phosphatase: 89 U/L (ref 39–117)
BUN: 11 mg/dL (ref 6–23)
CO2: 28 mEq/L (ref 19–32)
Calcium: 9.4 mg/dL (ref 8.4–10.5)
Chloride: 102 mEq/L (ref 96–112)
Creatinine, Ser: 1.04 mg/dL (ref 0.40–1.50)
GFR: 76.55 mL/min (ref >60.00)
Glucose, Bld: 75 mg/dL (ref 70–99)
Potassium: 4.1 mEq/L (ref 3.5–5.1)
Sodium: 137 mEq/L (ref 135–145)
Total Bilirubin: 1 mg/dL (ref 0.2–1.2)
Total Protein: 7 g/dL (ref 6.0–8.3)

## 2021-08-21 LAB — CBC WITH DIFFERENTIAL/PLATELET
Basophils Absolute: 0 10*3/uL (ref 0.0–0.1)
Basophils Relative: 0.9 % (ref 0.0–3.0)
Eosinophils Absolute: 0 10*3/uL (ref 0.0–0.7)
Eosinophils Relative: 0.2 % (ref 0.0–5.0)
HCT: 45.8 % (ref 39.0–52.0)
Hemoglobin: 15.1 g/dL (ref 13.0–17.0)
Lymphocytes Relative: 44.7 % (ref 12.0–46.0)
Lymphs Abs: 1.4 10*3/uL (ref 0.7–4.0)
MCHC: 32.9 g/dL (ref 30.0–36.0)
MCV: 93.7 fl (ref 78.0–100.0)
Monocytes Absolute: 0.4 10*3/uL (ref 0.1–1.0)
Monocytes Relative: 12.1 % — ABNORMAL HIGH (ref 3.0–12.0)
Neutro Abs: 1.3 10*3/uL — ABNORMAL LOW (ref 1.4–7.7)
Neutrophils Relative %: 42.1 % — ABNORMAL LOW (ref 43.0–77.0)
Platelets: 242 10*3/uL (ref 150.0–400.0)
RBC: 4.89 Mil/uL (ref 4.22–5.81)
RDW: 13.6 % (ref 11.5–15.5)
WBC: 3.2 10*3/uL — ABNORMAL LOW (ref 4.0–10.5)

## 2021-08-21 LAB — LIPID PANEL
Cholesterol: 195 mg/dL (ref 0–200)
HDL: 43.3 mg/dL (ref >39.00)
LDL Cholesterol: 123 mg/dL — ABNORMAL HIGH (ref 0–99)
NonHDL: 152.16
Total CHOL/HDL Ratio: 5
Triglycerides: 147 mg/dL (ref 0.0–149.0)
VLDL: 29.4 mg/dL (ref 0.0–40.0)

## 2021-08-21 LAB — PSA: PSA: 1.39 ng/mL (ref 0.10–4.00)

## 2021-08-21 NOTE — Patient Instructions (Addendum)
Health Maintenance Due  Topic Date Due   Zoster Vaccines- Shingrix (1 of 2) - Please check with your pharmacy to see if they have the shingrix vaccine. If they do- please get this immunization and update [place] by phone call or mychart with dates you receive the vaccine.  Never done   COVID-19 Vaccine (4 - Booster for [employer] series) -Recommend getting Omicron/Bivalent booster only at your local pharmacy! Please let us know when you have received this vaccination.  11/11/2020   INFLUENZA VACCINE  - Regular dose flu shot   05/14/2021   Please stop by lab before you go If you have mychart- we will send your results within 3 business days of [place] receiving them.  If you do not have mychart- we will call you about results within 5 business days of [place] receiving them.  *please also note that you will see labs on mychart as soon as they post. I will later go in and write notes on them- will say "notes from Dr. Yong Channel"  We will call you within two weeks about your referral to [place]. If you do not hear within 2 weeks, give [place] a call.   Recommended follow up: Return in about 6 months (around 02/18/2022) for physical or sooner if needed.

## 2021-08-21 NOTE — Addendum Note (Signed)
Addended by: Marin Olp on: 08/21/2021 01:25 PM   Modules accepted: Orders, SmartSet

## 2021-08-21 NOTE — Progress Notes (Signed)
Phone [phone] In person visit   Subjective:   Troy Christian is a 65 y.o. year old very pleasant male patient who presents for/with See problem oriented charting Chief Complaint  Patient presents with   Follow-up   Hyperlipidemia   Other    Pt would like test run for typhoid fever, he just returned from [place] and [place].   Leg Pain    Pt c/o leg/nerve pain in left leg.    This visit occurred during the SARS-CoV-2 public health emergency.  Safety protocols were in place, including screening questions prior to the visit, additional usage of staff PPE, and extensive cleaning of exam room while observing appropriate contact time as indicated for disinfecting solutions.   Past Medical History-  Patient Active Problem List   Diagnosis Date Noted   Hyperlipidemia     Priority: High   Spinal stenosis of lumbar region with neurogenic claudication 02/27/2018    Priority: Medium    BPH (benign prostatic hyperplasia) 01/07/2017    Priority: Medium    Former smoker 01/07/2017    Priority: Low   Hx of foreign travel 01/07/2017    Priority: Low   Elevated blood pressure reading     Priority: Low   Anxiety 08/07/2020    Medications- reviewed and updated Current Outpatient Medications  Medication Sig Dispense Refill   atorvastatin (LIPITOR) 10 MG tablet Take 1 tablet (10 mg total) by mouth daily. 90 tablet 3   busPIRone (BUSPAR) 10 MG tablet Take 10 mg by mouth 3 (three) times daily.     zinc gluconate 50 MG tablet Take 50 mg by mouth as needed.     No current facility-administered medications for this visit.     Objective:  BP 118/84   Pulse 71   Temp 98.5 F (36.9 C)   Ht 5\' 9"  (1.753 m)   Wt 180 lb 12.8 oz (82 kg)   SpO2 99%   BMI 26.70 kg/m  Gen: NAD, resting comfortably CV: RRR no murmurs rubs or gallops Lungs: CTAB no crackles, wheeze, rhonchi Abdomen: soft/nontender/nondistended/normal bowel sounds. No rebound or guarding.  Ext: no edema Skin: warm,  dry Straight leg raise negative - but does get some pain in hamstring without paresthesias. Good hip ROM- not able to reproduce pain in leg    Assessment and Plan   #hyperlipidemia S: Medication:prior atorvastatin 10 mg daily- started in [place] by GP there in the past at 20 mg but had some myalgias on this dose.  We reduced the dose to see if this would help with his side effects - he reports he stopped taking this completely since may Lab Results  Component Value Date   CHOL 113 12/12/2020   HDL 42.40 12/12/2020   LDLCALC 55 12/12/2020   TRIG 77.0 12/12/2020   CHOLHDL 3 12/12/2020   A/P: Patient with excellent LDL reduction from 160 in the past to 55 on atorvastatin 10 mg-thrilled with improvement/well-controlled-continue current medication  # Anxiety S:Medication: complaint with Buspar 10 mg 3x daily in past- hasnt needed lately  - was on Ativan 1 mg and Buspar 5 mg in the past - last visit patient was seen for this and palpitations and ultimately saw cardiology - event monitor in November 2021 and echocardiogram was largely reassuring - anxiety was contributing to by increased caffeine intake 9 per day-he reported he was drinking more coffee than usual (including about 6 cups in the AM) - no palpitations recently  Today patient reports doing better overall -  has been trying to exercise swimming 100 yards a day and does weightsand improve diet (cut down on sugar). Down 4 lbs from las tvisit A/P: improving with exercise- not needing medication right now- monitor without buspirone but can certainly use if needed   # Spinal stenosis of lumbar region-history of left-sided low back pain with sciatica S: symptoms started 3 months ago having pain from back of left leg  down into calf. No back pain.  No paresthesias, weakness, fall, injury, incontinence, saddle anesthesia  A/P: we discussed referral to sports medicine vs PT vs prednisone trial- hed like to see sports med as first steps     #Typhoid fever S: Chief Complaint  Patient presents with    Pt would like test run for typhoid fever, he just returned from [place] and [place].  - reports he had typhoid fever in [place]- positive - S typhi IgM positive and igG negative. Placed on antibiotics ciprofloxacin 500mg  twice daily for 7 days. Did some local herbs which helped. Was told needed follow up tested 2 weeks later (around now) . Was tested on October 13th it sounds like and IGM still positive and igg Negative- was told different strain (was not having symptoms then). Sounds like was injected with meropenem  No abdominal pain or fever at this time.   A/P: asymptomatic but was told should be retested by doctors in [place]- I have limited ordering options  but there    #History of adenomatous polyp of the colon-repeat in 7 years from March 2022-thanked patient for completing colonoscopy  #screen prostate cancer- trend with labs today- low risk in past Lab Results  Component Value Date   PSA 1.57 07/06/2020   PSA 1.56 01/07/2017   Recommended follow up: Return in about 6 months (around 02/18/2022) for physical or sooner if needed.  Lab/Order associations:   ICD-10-CM   1. Mixed hyperlipidemia  E78.2 CBC with Differential/Platelet    Comprehensive metabolic panel    Lipid panel    2. Anxiety  F41.9     3. Need for immunization against influenza  Z23 Flu Vaccine QUAD 28mo+IM (Fluarix, Fluzone & Alfiuria Quad PF)    4. Screening for prostate cancer  Z12.5 PSA    5. 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 Left leg pain  M79.605 Ambulatory referral to Sports Medicine     No orders of the defined types were placed in this encounter.  I,Harris Phan,acting as a Education administrator for Garret Reddish, MD.,have documented all relevant documentation on the behalf of Garret Reddish, MD,as directed by  Garret Reddish, MD while in the presence of Garret Reddish, MD.    I, Garret Reddish, MD, have reviewed all documentation  for this visit. The documentation on 08/21/21 for the exam, diagnosis, procedures, and orders are all accurate and complete.   Return precautions advised.  Garret Reddish, MD

## 2021-08-22 LAB — HIV ANTIBODY (ROUTINE TESTING W REFLEX): HIV 1&2 Ab, 4th Generation: NONREACTIVE

## 2021-08-22 LAB — RPR: RPR Ser Ql: NONREACTIVE

## 2021-08-22 NOTE — Telephone Encounter (Signed)
LAST APPOINTMENT DATE:  08/21/21  NEXT APPOINTMENT DATE: 02/19/22  MEDICATION:pravarstatin   PHARMACY:  [id number] - [place], [place] - [address] [place]

## 2021-08-22 NOTE — Telephone Encounter (Signed)
Just making sure im clear: im refilling pravastatin and NOT Rosuvastatin 10mg  that you mentioned him trying below?

## 2021-08-23 ENCOUNTER — Other Ambulatory Visit: Payer: Self-pay

## 2021-08-23 MED ORDER — ROSUVASTATIN CALCIUM 5 MG PO TABS: 5.0000 mg | ORAL_TABLET | Freq: Every day | ORAL | 3 refills | Status: DC

## 2021-08-30 NOTE — Progress Notes (Signed)
I, Peterson Lombard, LAT, ATC acting as a scribe for Lynne Leader, MD.  Subjective:    CC: L leg pain  HPI: Pt is a 65 y/o male c/o L leg pain x 3 months. Pt has a hx of lumbar spinal stenosis w/ neurogenic claudication and was seen by Rocco Pauls in 2020 for this issue. Pt c/o a nerve pain along the posterior aspect of the L leg intermittently.  Radiating pain: yes LE numbness/tingling: no LE weakness: no Aggravates: just randomly w/ flare up  Pt also c/o L shoulder pain ongoing since he had been taking a -statin medication. Pt locates pain to the super aspect of his L shoulder. Pt notes since switching his medication pain has improved, but he feels his L arm is noticeably weaker. Pt is R-hand dominate and swims daily.   Dx imaging: 06/03/19 L-spine MRI  02/06/18 L-spine MRI  09/12/17 L-spine XR  Pertinent review of Systems: No fevers or chills  Relevant historical information: Hyperlipidemia.  Managed with rosuvastatin 5 mg daily.  He spends part of the time in [place] or [place].   Objective:    Vitals:   08/31/21 1004  BP: 138/88  Pulse: 80  SpO2: 99%   General: Well Developed, well nourished, and in no acute distress.   MSK: Left shoulder normal-appearing Tender palpation AC joint. Range of motion full pain with abduction.  Strength 4/5 to abduction 5/5 external and internal rotation. Positive Hawkins and Neer's test.  Positive empty can test.  Negative Yergason's and speeds test.  L-spine normal.  Nontender normal lumbar motion.   Extremity strength is intact. Reflexes are intact. Positive left-sided slump test mildly.  Lab and Radiology Results   EXAM: MRI LUMBAR SPINE WITHOUT CONTRAST   TECHNIQUE: Multiplanar, multisequence MR imaging of the lumbar spine was performed. No intravenous contrast was administered.   COMPARISON:  02/06/2018   FINDINGS: Segmentation:  Standard.   Alignment:  Physiologic.   Vertebrae: No fracture, evidence of  discitis, or bone lesion. 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   Conus medullaris and cauda equina: Conus extends to the T12-L1 level. Conus and cauda equina appear normal.   Paraspinal and other soft tissues: Negative.   Disc levels:   T12-L1: No significant disc protrusion, foraminal stenosis, or canal stenosis.   L1-L2: No significant disc protrusion, foraminal stenosis, or canal stenosis.   L2-L3: Mild diffuse disc bulge with mild bilateral facet arthrosis and ligamentum flavum buckling. No foraminal stenosis. Borderline canal stenosis. No change from prior.   L3-L4: Diffuse disc bulge, eccentric to the right with a posterior annular fissure which is new from prior. Mild to moderate bilateral facet arthrosis and buckling of the ligamentum flavum result in mild-to-moderate left and mild right foraminal stenosis. Right greater than left subarticular recess stenosis and moderate canal stenosis. No significant interval progression in the degree of stenosis.   L4-L5: Diffuse disc bulge, left worse than right facet arthropathy, and ligamentum flavum buckling result in moderate left and mild right foraminal stenosis with moderate to severe canal stenosis. There is narrowing of the subarticular recesses bilaterally. No significant interval progression.   L5-S1: Diffuse disc bulge with moderate bilateral facet arthrosis without significant foraminal or canal stenosis. No change from prior.   IMPRESSION: 1. Multilevel lumbar spondylosis, most pronounced at the L3-4 level where there is moderate canal stenosis. Overall, no interval progression compared to prior MRI 02/06/2018. 2. Posterior annular fissure at L3-4, which appears new  from prior.     Electronically Signed   By: Davina Poke M.D.   On: 06/04/2019 08:35   I, Lynne Leader, personally (independently) visualized and performed the interpretation of the images attached  in this note.     Impression and Recommendations:    Assessment and Plan: 65 y.o. male with left shoulder pain thought to be due to impingement.  I am not sure there is much involvement with the initial statin myalgia here.  That may have been an issue previously but does not seem to be an issue now.  Plan to focus on home exercise program for rotator cuff and scapular strengthening.  Check back in about 2 months.  If not improving consider physical therapy.  Left leg pain due to S1 radiculopathy.  Symptoms are very intermittent and mild and not associated with weakness.  At this time symptoms do not raise to the level that significant treatment is necessary.  He would like to avoid medication.  Unfortunately his distribution of symptoms do not correspond with the MRI lumbar spine from August 2020.  If needed physical therapy would be helpful in the future and potentially repeat lumbar MRI.  Check back in 2 months..   Discussed warning signs or symptoms. Please see discharge instructions. Patient expresses understanding.   The above documentation has been reviewed and is accurate and complete Lynne Leader, M.D.

## 2021-08-31 ENCOUNTER — Ambulatory Visit (INDEPENDENT_AMBULATORY_CARE_PROVIDER_SITE_OTHER): Payer: Federal, State, Local not specified - PPO | Admitting: Family Medicine

## 2021-08-31 ENCOUNTER — Other Ambulatory Visit: Payer: Self-pay

## 2021-08-31 VITALS — BP 138/88 | HR 80 | Ht 69.0 in | Wt 185.2 lb

## 2021-08-31 DIAGNOSIS — M5432 Sciatica, left side: Secondary | ICD-10-CM

## 2021-08-31 DIAGNOSIS — M25512 Pain in left shoulder: Secondary | ICD-10-CM

## 2021-08-31 NOTE — Patient Instructions (Addendum)
Thank you for coming in today.   Please work on the exercises at home that the athletic trainer went over with you:  View at [email or web address] using code: [email or web address]  If not getting better, please let me know, and I can referred to physical therapy.  Recheck back in 2 months

## 2021-09-02 ENCOUNTER — Encounter: Payer: Self-pay | Admitting: Family Medicine

## 2021-11-26 ENCOUNTER — Encounter: Payer: Self-pay | Admitting: Family Medicine

## 2021-11-26 ENCOUNTER — Other Ambulatory Visit: Payer: Self-pay | Admitting: Family Medicine

## 2021-11-26 MED ORDER — BUSPIRONE HCL 10 MG PO TABS: 10.0000 mg | ORAL_TABLET | Freq: Three times a day (TID) | ORAL | 1 refills | Status: DC

## 2021-11-29 ENCOUNTER — Telehealth: Payer: Self-pay | Admitting: Family Medicine

## 2021-11-29 NOTE — Telephone Encounter (Signed)
Ok to use SDA if any available before 03/06.

## 2021-11-29 NOTE — Telephone Encounter (Signed)
Patient called for medications, stated he needs to see dr hunter as he knows his issues. Patient has been sch for 3/6 at 4pm however patient would like to know if he can be seen sooner by dr hunter.

## 2021-12-10 NOTE — Progress Notes (Signed)
? ?Phone [phone] ?In person visit ?  ?Subjective:  ? ?Troy Christian is a 66 y.o. year old very pleasant male patient who presents for/with See problem oriented charting ?Chief Complaint  ?Patient presents with  ? Follow-up  ? Hyperlipidemia  ? Anxiety  ? Stress  ?  Pt states he sent you a message regarding adding a medication for stress.  ? ?This visit occurred during the SARS-CoV-2 public health emergency.  Safety protocols were in place, including screening questions prior to the visit, additional usage of staff PPE, and extensive cleaning of exam room while observing appropriate contact time as indicated for disinfecting solutions.  ? ?Past Medical History-  ?Patient Active Problem List  ? Diagnosis Date Noted  ? Hyperlipidemia   ?  Priority: High  ? Anxiety 08/07/2020  ?  Priority: Medium   ? Spinal stenosis of lumbar region with neurogenic claudication 02/27/2018  ?  Priority: Medium   ? BPH (benign prostatic hyperplasia) 01/07/2017  ?  Priority: Medium   ? Former smoker 01/07/2017  ?  Priority: Low  ? Hx of foreign travel 01/07/2017  ?  Priority: Low  ? Elevated blood pressure reading   ?  Priority: Low  ? ? ?Medications- reviewed and updated ?Current Outpatient Medications  ?Medication Sig Dispense Refill  ? LORazepam (ATIVAN) 0.5 MG tablet Take 1 tablet (0.5 mg total) by mouth daily as needed for anxiety (do not drive for 8 hours after taking). 30 tablet 1  ? rosuvastatin (CRESTOR) 5 MG tablet Take 1 tablet (5 mg total) by mouth daily. 90 tablet 3  ? ?No current facility-administered medications for this visit.  ? ?  ?Objective:  ?BP 98/70   Pulse 72   Temp 98.1 ?F (36.7 ?C)   Ht 5\' 9"  (1.753 m)   Wt 185 lb 12.8 oz (84.3 kg)   SpO2 99%   BMI 27.44 kg/m?  ?Gen: NAD, resting comfortably ?CV: RRR no murmurs rubs or gallops ?Lungs: CTAB no crackles, wheeze, rhonchi ?Ext: no edema ?Skin: warm, dry ?  ? ?Assessment and Plan  ? ? ?#hyperlipidemia ?S: Medication:prior atorvastatin 10 mg daily- started in  [place] by GP there in the past at 20 mg but had some myalgias on this dose.  We reduced the dose to see if this would help with his side effects ?- he reported he stopped taking this completely since may ?- then later transition to rosuvastatin ?Lab Results  ?Component Value Date  ? CHOL 195 08/21/2021  ? HDL 43.30 08/21/2021  ? LDLCALC 123 (H) 08/21/2021  ? TRIG 147.0 08/21/2021  ? CHOLHDL 5 08/21/2021  ? A/P: Patient would like to see how his lipids are doing on rosuvastatin 5 mg-lipid panel was obtained earlier today but results were not back yet-I will send him a message about results-suspect improved and will continue current medicine ? ?# Anxiety/stress ?S:stressors:he is putting a lot into a project and has had to pull some retirement money to work on this. No recent palpitations since cutting caffeine ? ?Medication:  ?- complaint with Buspar 10 mg 3x daily in past- helps him but has to take for 2 weeks.  ?- was on Ativan 1 mg and found that more helpful- once daily as needed- variable use.  ?-jittery on lexapro ? ?Triggers: ?-has kept his caffeine down- had been 6 cups in the mornings ?-not exercising- has reversed this ? ?A/P: Patient with ongoing anxiety/stress-but did not tolerate prior SSRI trial and had minimal benefit on buspirone-has  done better on lorazepam we opted to try a lower dose 0.5 mg and strongly encourage sparing use-we went over potential risks particularly addictive potential and importance of not driving while medication is in the system extensively ? ?#prostate concern- prostate cancer screening-patient asks about doing another PSA but since this has been under 6 months I recommended we wait until his physical-he is in agreement and encouraged physical within 6 months ?Lab Results  ?Component Value Date  ? PSA 1.39 08/21/2021  ? PSA 1.57 07/06/2020  ? PSA 1.56 01/07/2017  ? ?# Left shoulder pain ?S:-some muscular pain in left shoulder  - swimming several hundred yards every other day  and lifting weights but doesn't bother him  ?A/P: saw Dr. Georgina Snell 08/31/21 - next step was see PT if not improved- he declines for now  ? ?#sciatica comes and goes but much better overall- see prior notes- doesn't want to do anything further at this point ? ?Recommended follow up: Had planned for 4 to 84-month physical but he is already scheduled in May-he will keep that visit ?Future Appointments  ?Date Time Provider [place]  ?02/19/2022  1:00 PM Julia Kulzer, Brayton Mars, MD [facility]  ? ? ?Lab/Order associations: ?  ICD-10-CM   ?1. Hyperlipidemia, unspecified hyperlipidemia type  E78.5   ?  ?2. Anxiety  F41.9   ?  ? ? ?Meds ordered this encounter  ?Medications  ? LORazepam (ATIVAN) 0.5 MG tablet  ?  Sig: Take 1 tablet (0.5 mg total) by mouth daily as needed for anxiety (do not drive for 8 hours after taking).  ?  Dispense:  30 tablet  ?  Refill:  1  ? ? ?I,Jada Bradford,acting as a scribe for Garret Reddish, MD.,have documented all relevant documentation on the behalf of Garret Reddish, MD,as directed by  Garret Reddish, MD while in the presence of Garret Reddish, MD. ? ? I, Garret Reddish, MD, have reviewed all documentation for this visit. The documentation on 12/17/21 for the exam, diagnosis, procedures, and orders are all accurate and complete.  ? ?Return precautions advised.  ?Garret Reddish, MD ? ? ?

## 2021-12-17 ENCOUNTER — Other Ambulatory Visit: Payer: Self-pay

## 2021-12-17 ENCOUNTER — Encounter: Payer: Self-pay | Admitting: Family Medicine

## 2021-12-17 ENCOUNTER — Ambulatory Visit: Payer: Federal, State, Local not specified - PPO | Admitting: Family Medicine

## 2021-12-17 ENCOUNTER — Other Ambulatory Visit (INDEPENDENT_AMBULATORY_CARE_PROVIDER_SITE_OTHER): Payer: Federal, State, Local not specified - PPO

## 2021-12-17 VITALS — BP 98/70 | HR 72 | Temp 98.1°F | Ht 69.0 in | Wt 185.8 lb

## 2021-12-17 DIAGNOSIS — F419 Anxiety disorder, unspecified: Secondary | ICD-10-CM | POA: Diagnosis not present

## 2021-12-17 DIAGNOSIS — E782 Mixed hyperlipidemia: Secondary | ICD-10-CM

## 2021-12-17 DIAGNOSIS — E785 Hyperlipidemia, unspecified: Secondary | ICD-10-CM

## 2021-12-17 DIAGNOSIS — M48062 Spinal stenosis, lumbar region with neurogenic claudication: Secondary | ICD-10-CM

## 2021-12-17 MED ORDER — LORAZEPAM 0.5 MG PO TABS: 0.5000 mg | ORAL_TABLET | Freq: Every day | ORAL | 1 refills | Status: DC | PRN

## 2021-12-17 NOTE — Assessment & Plan Note (Signed)
S:stressors:he is putting a lot into a project and has had to pull some retirement money to work on this. No recent palpitations since cutting caffeine ? ?Medication:  ?- complaint with Buspar 10 mg 3x daily in past- helps him but has to take for 2 weeks.  ?- was on Ativan 1 mg and found that more helpful- once daily as needed- variable use.  ?-jittery on lexapro ? ?Triggers: ?-has kept his caffeine down- had been 6 cups in the mornings ?-not exercising- has reversed this ? ?A/P: Patient with ongoing anxiety/stress-but did not tolerate prior SSRI trial and had minimal benefit on buspirone-has done better on lorazepam we opted to try a lower dose 0.5 mg and strongly encourage sparing use-we went over potential risks particularly addictive potential and importance of not driving while medication is in the system extensively ?

## 2021-12-17 NOTE — Patient Instructions (Addendum)
I will let you know results of cholesterol medicine- likely continue current medicine ? ?Lets try low dose ativan- remember do not drive for 8 hours after taking and about the potential addictive nature- want to minimize this as much as possible ? ?Recommended follow up: Return in about 4 months (around 04/18/2022) for physical or sooner if needed. ?

## 2021-12-18 LAB — LIPID PANEL
Cholesterol: 128 mg/dL (ref 0–200)
HDL: 49.1 mg/dL (ref >39.00)
LDL Cholesterol: 64 mg/dL (ref 0–99)
NonHDL: 79.36
Total CHOL/HDL Ratio: 3
Triglycerides: 77 mg/dL (ref 0.0–149.0)
VLDL: 15.4 mg/dL (ref 0.0–40.0)

## 2022-02-15 ENCOUNTER — Telehealth: Payer: Self-pay | Admitting: Family Medicine

## 2022-02-15 DIAGNOSIS — Z0000 Encounter for general adult medical examination without abnormal findings: Secondary | ICD-10-CM

## 2022-02-15 DIAGNOSIS — E782 Mixed hyperlipidemia: Secondary | ICD-10-CM

## 2022-02-15 DIAGNOSIS — Z125 Encounter for screening for malignant neoplasm of prostate: Secondary | ICD-10-CM

## 2022-02-15 DIAGNOSIS — E785 Hyperlipidemia, unspecified: Secondary | ICD-10-CM

## 2022-02-15 NOTE — Telephone Encounter (Signed)
Labs ordered.

## 2022-02-15 NOTE — Telephone Encounter (Signed)
Left vm for patient to call back to schedule appointment.

## 2022-02-15 NOTE — Telephone Encounter (Signed)
Pt is requesting cholesterol screening at [facility] scheduled for 05/09 at 1:00pm ? ?Pt would like give blood for screening in the [place] so he does not have to fast all day. ? ?If approved, please put in orders and route back to [facility] admin pool to schedule the pt.  ?

## 2022-02-18 ENCOUNTER — Other Ambulatory Visit (INDEPENDENT_AMBULATORY_CARE_PROVIDER_SITE_OTHER): Payer: Federal, State, Local not specified - PPO

## 2022-02-18 DIAGNOSIS — Z125 Encounter for screening for malignant neoplasm of prostate: Secondary | ICD-10-CM | POA: Diagnosis not present

## 2022-02-18 DIAGNOSIS — Z0000 Encounter for general adult medical examination without abnormal findings: Secondary | ICD-10-CM | POA: Diagnosis not present

## 2022-02-18 DIAGNOSIS — E785 Hyperlipidemia, unspecified: Secondary | ICD-10-CM

## 2022-02-18 LAB — CBC WITH DIFFERENTIAL/PLATELET
Basophils Absolute: 0 10*3/uL (ref 0.0–0.1)
Basophils Relative: 0.7 % (ref 0.0–3.0)
Eosinophils Absolute: 0 10*3/uL (ref 0.0–0.7)
Eosinophils Relative: 0.2 % (ref 0.0–5.0)
HCT: 44.8 % (ref 39.0–52.0)
Hemoglobin: 15 g/dL (ref 13.0–17.0)
Lymphocytes Relative: 41.9 % (ref 12.0–46.0)
Lymphs Abs: 1.5 10*3/uL (ref 0.7–4.0)
MCHC: 33.4 g/dL (ref 30.0–36.0)
MCV: 92.9 fl (ref 78.0–100.0)
Monocytes Absolute: 0.5 10*3/uL (ref 0.1–1.0)
Monocytes Relative: 14.5 % — ABNORMAL HIGH (ref 3.0–12.0)
Neutro Abs: 1.5 10*3/uL (ref 1.4–7.7)
Neutrophils Relative %: 42.7 % — ABNORMAL LOW (ref 43.0–77.0)
Platelets: 228 10*3/uL (ref 150.0–400.0)
RBC: 4.83 Mil/uL (ref 4.22–5.81)
RDW: 13.4 % (ref 11.5–15.5)
WBC: 3.5 10*3/uL — ABNORMAL LOW (ref 4.0–10.5)

## 2022-02-18 LAB — LIPID PANEL
Cholesterol: 166 mg/dL (ref 0–200)
HDL: 45.8 mg/dL (ref >39.00)
LDL Cholesterol: 103 mg/dL — ABNORMAL HIGH (ref 0–99)
NonHDL: 120.57
Total CHOL/HDL Ratio: 4
Triglycerides: 86 mg/dL (ref 0.0–149.0)
VLDL: 17.2 mg/dL (ref 0.0–40.0)

## 2022-02-18 LAB — COMPREHENSIVE METABOLIC PANEL
ALT: 16 U/L (ref 0–53)
AST: 18 U/L (ref 0–37)
Albumin: 4.6 g/dL (ref 3.5–5.2)
Alkaline Phosphatase: 75 U/L (ref 39–117)
BUN: 13 mg/dL (ref 6–23)
CO2: 27 mEq/L (ref 19–32)
Calcium: 9.2 mg/dL (ref 8.4–10.5)
Chloride: 102 mEq/L (ref 96–112)
Creatinine, Ser: 1.29 mg/dL (ref 0.40–1.50)
GFR: 58.91 mL/min — ABNORMAL LOW (ref >60.00)
Glucose, Bld: 97 mg/dL (ref 70–99)
Potassium: 4 mEq/L (ref 3.5–5.1)
Sodium: 137 mEq/L (ref 135–145)
Total Bilirubin: 1.3 mg/dL — ABNORMAL HIGH (ref 0.2–1.2)
Total Protein: 7 g/dL (ref 6.0–8.3)

## 2022-02-18 LAB — PSA: PSA: 2.14 ng/mL (ref 0.10–4.00)

## 2022-02-19 ENCOUNTER — Encounter: Payer: Self-pay | Admitting: Family Medicine

## 2022-02-19 ENCOUNTER — Ambulatory Visit (INDEPENDENT_AMBULATORY_CARE_PROVIDER_SITE_OTHER): Payer: Federal, State, Local not specified - PPO | Admitting: Family Medicine

## 2022-02-19 VITALS — BP 112/76 | HR 61 | Temp 98.8°F | Ht 69.0 in | Wt 183.1 lb

## 2022-02-19 DIAGNOSIS — Z114 Encounter for screening for human immunodeficiency virus [HIV]: Secondary | ICD-10-CM

## 2022-02-19 DIAGNOSIS — E782 Mixed hyperlipidemia: Secondary | ICD-10-CM

## 2022-02-19 DIAGNOSIS — Z0000 Encounter for general adult medical examination without abnormal findings: Secondary | ICD-10-CM

## 2022-02-19 DIAGNOSIS — Z118 Encounter for screening for other infectious and parasitic diseases: Secondary | ICD-10-CM

## 2022-02-19 DIAGNOSIS — Z113 Encounter for screening for infections with a predominantly sexual mode of transmission: Secondary | ICD-10-CM

## 2022-02-19 DIAGNOSIS — R972 Elevated prostate specific antigen [PSA]: Secondary | ICD-10-CM

## 2022-02-19 DIAGNOSIS — F419 Anxiety disorder, unspecified: Secondary | ICD-10-CM

## 2022-02-19 MED ORDER — COENZYME Q10 30 MG PO CAPS: 30.0000 mg | ORAL_CAPSULE | Freq: Two times a day (BID) | ORAL | 3 refills | Status: DC

## 2022-02-19 NOTE — Progress Notes (Signed)
?Phone: [phone] ?  ?Subjective:  ?Patient presents today for their annual physical. Chief complaint-noted.  ? ?See problem oriented charting- ?ROS- full  review of systems was completed and negative  ?except for: some shoulder pain on left and doing better- some erectile issues ? ?The following were reviewed and entered/updated in epic: ?Past Medical History:  ?Diagnosis Date  ? Allergy   ? MILD CURRENTLY  ? Elevated blood pressure reading   ? states up with stress.   ? GERD (gastroesophageal reflux disease)   ? WITH STRESS - PRN TUMS   ? Hyperlipidemia   ? no rx  ? ?Patient Active Problem List  ? Diagnosis Date Noted  ? Hyperlipidemia   ?  Priority: High  ? Anxiety 08/07/2020  ?  Priority: Medium   ? Spinal stenosis of lumbar region with neurogenic claudication 02/27/2018  ?  Priority: Medium   ? BPH (benign prostatic hyperplasia) 01/07/2017  ?  Priority: Medium   ? Former smoker 01/07/2017  ?  Priority: Low  ? Hx of foreign travel 01/07/2017  ?  Priority: Low  ? Elevated blood pressure reading   ?  Priority: Low  ? ?Past Surgical History:  ?Procedure Laterality Date  ? WISDOM TOOTH EXTRACTION    ? ? ?Family History  ?Problem Relation Age of Onset  ? Hypertension Mother   ? Pneumonia Mother   ?     lived to 69  ? Hypertension Father   ? Prostate cancer Father   ?     lived to 64  ? Asthma Sister   ?     due to asthma  ? Prostate cancer Brother   ?     he is a gynecologist in [place]  ? CAD Brother   ?     suddent cardiac arrest at age 70  ? Colon cancer Neg Hx   ? Colon polyps Neg Hx   ? Esophageal cancer Neg Hx   ? Rectal cancer Neg Hx   ? Stomach cancer Neg Hx   ? ? ?Medications- reviewed and updated ?Current Outpatient Medications  ?Medication Sig Dispense Refill  ? co-enzyme Q-10 30 MG capsule Take 1 capsule (30 mg total) by mouth 2 (two) times daily. 180 capsule 3  ? LORazepam (ATIVAN) 0.5 MG tablet Take 1 tablet (0.5 mg total) by mouth daily as needed for anxiety (do not drive for 8 hours after taking).  30 tablet 1  ? rosuvastatin (CRESTOR) 5 MG tablet Take 1 tablet (5 mg total) by mouth daily. 90 tablet 3  ? ?No current facility-administered medications for this visit.  ? ?Allergies-reviewed and updated ?Allergies  ?Allergen Reactions  ? Penicillins   ?  unknown  ? ? ?Social History  ? ?Social History Narrative  ? Lives alone here. . Married around 2003- wife lives in [place]- she will move here- critical care nurse  ? From [place]. Has been to 75 countries.   ?   ? Retired around 53. 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 Used to work for [employer]  ? Did go to med school a year- professor led him to environmental toxicology  ?   ? 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   ? Hobbies: antique cars, still works sometimes  ? ?Objective  ?Objective:  ?BP 112/76   Pulse 61   Temp 98.8 ?F (37.1 ?C) (Temporal)   Ht '5\' 9"'$  (1.753 m)   Wt 183 lb 2 oz (83.1 kg)   SpO2 98%  BMI 27.04 kg/m?  ?Gen: NAD, resting comfortably ?HEENT: Mucous membranes are moist. Oropharynx normal ?Neck: no thyromegaly ?CV: RRR no murmurs rubs or gallops ?Lungs: CTAB no crackles, wheeze, rhonchi ?Abdomen: soft/nontender/nondistended/normal bowel sounds. No rebound or guarding.  ?Ext: no edema ?Skin: warm, dry ?Neuro: grossly normal, moves all extremities, PERRLA ?  ?Assessment and Plan  ?66 y.o. male presenting for annual physical.  ?Health Maintenance counseling: ?1. Anticipatory guidance: Patient counseled regarding regular dental exams - in general q6 months, eye exams - yearly,  avoiding smoking and second hand smoke, limiting alcohol to 2 beverages per day , no illicit drugs.   ?2. Risk factor reduction:  Advised patient of need for regular exercise and diet rich and fruits and vegetables to reduce risk of heart attack and stroke.  ?Exercise- swimming daily at [facility] and doing weights  ?Diet/weight management-down 2 lbs from last year and feels has gained muscle mass in that time frame.  ?Wt Readings from Last 3 Encounters:   ?02/19/22 183 lb 2 oz (83.1 kg)  ?12/17/21 185 lb 12.8 oz (84.3 kg)  ?08/31/21 185 lb 3.2 oz (84 kg)  ?3. Immunizations/screenings/ancillary studies-declines further COVID vaccination  ?Immunization History  ?Administered Date(s) Administered  ? Influenza,inj,Quad PF,6+ Mos 06/28/2019, 07/06/2020, 08/21/2021  ? Influenza-Unspecified 08/28/2016, 08/12/2017  ? PFIZER(Purple Top)SARS-COV-2 Vaccination 11/17/2019, 02/01/2020, 09/16/2020  ? Tdap 07/06/2020  ? 4. Prostate cancer screening- PSA at some from November check-recommended rechecking in 1 month with no sex or ejaculation for 2 days prior- he agrees - recheck in 1 month . If psa goes up further- refer to urology ?Lab Results  ?Component Value Date  ? PSA 2.14 02/18/2022  ? PSA 1.39 08/21/2021  ? PSA 1.57 07/06/2020  ? 5. Colon cancer screening - #History of adenomatous polyp of the colon-repeat in 7 years from March 2022 ?6. Skin cancer screening- lower risk due to melanin content. advised regular sunscreen use. Denies worrisome, changing, or new skin lesions.  ?7. Smoking associated screening (lung cancer screening, AAA screen 65-75, UA)-former smoker- quit smoking in 2010-no regular screening until 65  ?8. STD screening - only active 1 partner- opts out of STD screening ? ?Status of chronic or acute concerns  ? ?#hyperlipidemia ?S: Medication: Rosuvastatin 5 mg 3x a week starting 3 weeks ago- had shoulder discomfort on taking daily- doing better now.   Previously on atorvastatin 10 mg per [facility] ?Lab Results  ?Component Value Date  ? CHOL 166 02/18/2022  ? HDL 45.80 02/18/2022  ? LDLCALC 103 (H) 02/18/2022  ? TRIG 86.0 02/18/2022  ? CHOLHDL 4 02/18/2022  ? A/P: Not to ideal goal-discussed since had only been on for 3 weeks or so to continue consistently 3x a week and if tolerates go to 4x a week  ?-shoulder doing better but wants to try coenzyme Q 10- we have sent this in for him ? ? # Anxiety ?S:Medication: Lorazepam 0.5 mg as needed typically- working  on a [employer] and has a lot of stress with this so imperfectly controlled. He is mainly using for sleep occasionally  ?-To be on Lexapro, buspirone only effective ?Counseling: not interested ?A/P: reasonably stable- continue current medicine  ? ?#Slightly decreased GFR-trend annually-avoid NSAIDs - reports with recent stomach bug wasn't drinking as much water- plans to increase- can recheck when do PSA ? ?#Leukopenia-chronically low white count 3.5 thousand-trend at least annually but appears to be his baseline-improved slightly from last year  ? ?#Slightly high bilirubin-slight increase over the last  few years-continue to trend- he reports had stomach upset recently and took pepto bismol and had read could affect liver labs  ?  ?Recommended follow up: Return in 1 year (on 02/20/2023) for physical or sooner if needed.Schedule b4 you leave. ? ?Lab/Order associations: Already had labs ?  ICD-10-CM   ?1. Routine general medical examination at a health care facility  Z00.00   ?  ?2. Mixed hyperlipidemia  T66.0 Basic metabolic panel  ?  ?3. Anxiety  F41.9   ?  ?4. Elevated PSA  R97.20 PSA  ?  ?5. Screening for HIV (human immunodeficiency virus)  Z11.4 HIV Antibody (routine testing w rflx)  ?  ?6. Screening examination for venereal disease  Z11.3 RPR  ?  ?7. Screening for gonorrhea  Z11.3 Urine cytology ancillary only  ?  ?8. Screening for chlamydial disease  Z11.8 Urine cytology ancillary only  ?  ? ? ?Meds ordered this encounter  ?Medications  ? co-enzyme Q-10 30 MG capsule  ?  Sig: Take 1 capsule (30 mg total) by mouth 2 (two) times daily.  ?  Dispense:  180 capsule  ?  Refill:  3  ? ? ?Return precautions advised.  ?Garret Reddish, MD ? ? ?

## 2022-02-19 NOTE — Patient Instructions (Addendum)
Cholesterol Not to ideal goal-discussed since had only been on for 3 weeks or so to continue consistently 3x a week and if tolerates go to 4x a week  ? ?-shoulder doing better but wants to try coenzyme Q 10- we have sent this in for him ? ?Schedule a lab visit in about a month before you leave ? ?Recommended follow up: Return in 1 year (on 02/20/2023) for physical or sooner if needed.Schedule b4 you leave. ?

## 2022-02-20 ENCOUNTER — Other Ambulatory Visit: Payer: Self-pay

## 2022-02-20 NOTE — Progress Notes (Signed)
I apologize-believe I meant to say took himself off of Lexapro and buspirone.  If he wants to trial buspirone again can certainly resend back in ? ? ?

## 2022-02-21 ENCOUNTER — Other Ambulatory Visit: Payer: Self-pay

## 2022-02-21 MED ORDER — BUSPIRONE HCL 10 MG PO TABS: 10.0000 mg | ORAL_TABLET | Freq: Three times a day (TID) | ORAL | 1 refills | Status: DC

## 2022-02-21 NOTE — Progress Notes (Signed)
Rx sent to pharmacy   

## 2022-03-14 ENCOUNTER — Telehealth: Payer: Self-pay | Admitting: Family Medicine

## 2022-03-14 NOTE — Telephone Encounter (Signed)
Pt has labs scheduled for 03/18/22. He was told during his physical he would need to stop taking something 2 days prior to the labs but does not remember what it was. Please advise

## 2022-03-14 NOTE — Telephone Encounter (Signed)
"  PSA up some from November check-recommended rechecking in 1 month with no sex or ejaculation for 2 days prior- he agrees - recheck in 1 month" also avoid vigorous exercise

## 2022-03-14 NOTE — Telephone Encounter (Signed)
I checked last OV and lab visit documentation, I did not find what you wanted him to stop taking.

## 2022-03-15 NOTE — Telephone Encounter (Signed)
Called and lm on pt confidential vm with below message.

## 2022-03-17 ENCOUNTER — Encounter: Payer: Self-pay | Admitting: Family Medicine

## 2022-03-18 ENCOUNTER — Encounter: Payer: Self-pay | Admitting: Family Medicine

## 2022-03-18 ENCOUNTER — Ambulatory Visit (INDEPENDENT_AMBULATORY_CARE_PROVIDER_SITE_OTHER): Payer: Federal, State, Local not specified - PPO | Admitting: Family Medicine

## 2022-03-18 ENCOUNTER — Other Ambulatory Visit (INDEPENDENT_AMBULATORY_CARE_PROVIDER_SITE_OTHER): Payer: Federal, State, Local not specified - PPO

## 2022-03-18 ENCOUNTER — Other Ambulatory Visit (HOSPITAL_COMMUNITY)
Admission: RE | Admit: 2022-03-18 | Discharge: 2022-03-18 | Disposition: A | Discharge status: Home / Self Care | Payer: Federal, State, Local not specified - PPO | Source: Ambulatory Visit | Attending: Cardiology | Admitting: Cardiology

## 2022-03-18 VITALS — BP 128/84 | HR 73 | Temp 98.0°F | Ht 69.0 in | Wt 182.8 lb

## 2022-03-18 DIAGNOSIS — R42 Dizziness and giddiness: Secondary | ICD-10-CM | POA: Diagnosis not present

## 2022-03-18 DIAGNOSIS — R972 Elevated prostate specific antigen [PSA]: Secondary | ICD-10-CM

## 2022-03-18 DIAGNOSIS — Z113 Encounter for screening for infections with a predominantly sexual mode of transmission: Secondary | ICD-10-CM | POA: Insufficient documentation

## 2022-03-18 DIAGNOSIS — Z114 Encounter for screening for human immunodeficiency virus [HIV]: Secondary | ICD-10-CM | POA: Diagnosis not present

## 2022-03-18 DIAGNOSIS — E782 Mixed hyperlipidemia: Secondary | ICD-10-CM

## 2022-03-18 DIAGNOSIS — F419 Anxiety disorder, unspecified: Secondary | ICD-10-CM | POA: Diagnosis not present

## 2022-03-18 DIAGNOSIS — Z118 Encounter for screening for other infectious and parasitic diseases: Secondary | ICD-10-CM | POA: Insufficient documentation

## 2022-03-18 LAB — BASIC METABOLIC PANEL
BUN: 14 mg/dL (ref 6–23)
CO2: 25 mEq/L (ref 19–32)
Calcium: 9.4 mg/dL (ref 8.4–10.5)
Chloride: 104 mEq/L (ref 96–112)
Creatinine, Ser: 1.19 mg/dL (ref 0.40–1.50)
GFR: 64.86 mL/min (ref >60.00)
Glucose, Bld: 93 mg/dL (ref 70–99)
Potassium: 4.3 mEq/L (ref 3.5–5.1)
Sodium: 138 mEq/L (ref 135–145)

## 2022-03-18 LAB — PSA: PSA: 1.61 ng/mL (ref 0.10–4.00)

## 2022-03-18 NOTE — Progress Notes (Signed)
Phone [phone] In person visit   Subjective:   Troy Christian is a 66 y.o. year old very pleasant male patient who presents for/with See problem oriented charting Chief Complaint  Patient presents with   Dizziness    Pt c/o diziness and drowsiness that started Friday evening and when laying down on his back the room was spinning. He did go get some otc motion sickness patches which helped. His bp was normal. Denies chest pain/ SOB.    Past Medical History-  Patient Active Problem List   Diagnosis Date Noted   Hyperlipidemia     Priority: High   Anxiety 08/07/2020    Priority: Medium    Spinal stenosis of lumbar region with neurogenic claudication 02/27/2018    Priority: Medium    BPH (benign prostatic hyperplasia) 01/07/2017    Priority: Medium    Former smoker 01/07/2017    Priority: Low   Hx of foreign travel 01/07/2017    Priority: Low   Elevated blood pressure reading     Priority: Low    Medications- reviewed and updated Current Outpatient Medications  Medication Sig Dispense Refill   busPIRone (BUSPAR) 10 MG tablet Take 1 tablet (10 mg total) by mouth 3 (three) times daily. 270 tablet 1   co-enzyme Q-10 30 MG capsule Take 1 capsule (30 mg total) by mouth 2 (two) times daily. 180 capsule 3   LORazepam (ATIVAN) 0.5 MG tablet Take 1 tablet (0.5 mg total) by mouth daily as needed for anxiety (do not drive for 8 hours after taking). 30 tablet 1   rosuvastatin (CRESTOR) 5 MG tablet Take 1 tablet (5 mg total) by mouth daily. 90 tablet 3   No current facility-administered medications for this visit.     Objective:  BP 128/84   Pulse 73   Temp 98 F (36.7 C)   Ht '5\' 9"'$  (1.753 m)   Wt 182 lb 12.8 oz (82.9 kg)   SpO2 98%   BMI 26.99 kg/m  Gen: NAD, resting comfortably CV: RRR no murmurs rubs or gallops Lungs: CTAB no crackles, wheeze, rhonchi Ext: no edema Skin: warm, dry  Neuro: CN II-XII intact, sensation and reflexes normal throughout, 5/5 muscle strength in  bilateral upper and lower extremities. Normal finger to nose. Normal rapid alternating movements. No pronator drift. Normal romberg. Normal gait.  Dix-Hallpike without nystagmus    Assessment and Plan   # Vertigo /dizziness S:Symptoms started after swimming on Friday. Started to feel dizzy after doing a normal tapping regimen he does on back ofe head and neck. Had a sensation like being on a boat. Overall somewhat mild. Does report high stress levels with a situation with collection of paintings. Swimming usually helps stress.   After the gym got home and symptoms intensified, felt off balanced and room was spinning. Worsened when laying down unless he laid on right side side. Standing up sometimes actually helped.  - no chest pain or shortness of breath or palpitations - blood pressure was 117/79 -took otc motion sickness patches helped as well as lorazepam - 2 tablets.   Symptoms are are improving Ever had similar symptoms: No  ROS- No facial or extremity weakness. No slurred words or trouble swallowing. no blurry vision or double vision. No paresthesias. No confusion or word finding difficulties.  No hearing loss  No tinnitus    A/P: 66 year old man with what sounds like to be benign paroxysmal positional vertigo on Friday that has improved/largely resolved but has a very  low level persistent dizziness and general drowsiness feeling.  I wonder if he had an element of BPPV which corrected overnight-has not had as much issues with position changes.  Dix-Hallpike without nystagmus today.  Overall he feels much improved.  He was also concerned about initial blood pressure being somewhat elevated but improved on recheck.  Reassuring neurological exam-we agreed to follow-up if symptoms worsen again or persist over 2 weeks  #hyperlipidemia S: Medication: Rosuvastatin only taking 3 days a week Lab Results  Component Value Date   CHOL 166 02/18/2022   HDL 45.80 02/18/2022   LDLCALC 103 (H)  02/18/2022   TRIG 86.0 02/18/2022   CHOLHDL 4 02/18/2022   A/P: Mild poor control-discussed trying to get LDL at least under 70-she is concerned about myalgias potential but feels he is tolerating 3 days a week-we opted to try either every other day or 4 days a week  #Anxiety-patient's symptoms did improve with lorazepam-could have anxiety element versus may have helped with BPPV.  Patient also believes anxiety contributed to higher blood pressure initially.  He is under a lot of stress-he wants to continue with sparing medication for now mainly lorazepam since buspirone did not seem to have as big of an effect but of course can always start with buspirone  Recommended follow up: Return for as needed for new, worsening, persistent symptoms. Future Appointments  Date Time Provider [place]  02/24/2023  1:00 PM Marin Olp, MD [facility]    Lab/Order associations:   ICD-10-CM   1. Vertigo  R42     2. Mixed hyperlipidemia  E78.2     3. Anxiety  F41.9       Return precautions advised.  Garret Reddish, MD

## 2022-03-18 NOTE — Patient Instructions (Addendum)
Glad you are doing better. If you fail to continue to improve please see [place] back- I suspect you will be better in a few days if you take it easy on yourself!   Increase rosuvastatin to 4 days a week please especially if you aren't going to curb the red meat  Recommended follow up: Return for as needed for new, worsening, persistent symptoms.

## 2022-03-19 LAB — URINE CYTOLOGY ANCILLARY ONLY
Chlamydia: NEGATIVE
Comment: NEGATIVE
Comment: NEGATIVE
Comment: NORMAL
Neisseria Gonorrhea: NEGATIVE
Trichomonas: NEGATIVE

## 2022-03-19 LAB — HIV ANTIBODY (ROUTINE TESTING W REFLEX): HIV 1&2 Ab, 4th Generation: NONREACTIVE

## 2022-03-19 LAB — RPR: RPR Ser Ql: NONREACTIVE

## 2022-07-08 ENCOUNTER — Encounter: Payer: Self-pay | Admitting: *Deleted

## 2022-09-26 ENCOUNTER — Encounter: Payer: Self-pay | Admitting: *Deleted

## 2023-02-24 ENCOUNTER — Encounter: Payer: Federal, State, Local not specified - PPO | Admitting: Family Medicine

## 2024-01-15 ENCOUNTER — Ambulatory Visit: Admitting: Family Medicine

## 2024-01-15 ENCOUNTER — Encounter: Payer: Self-pay | Admitting: Family Medicine

## 2024-01-15 VITALS — BP 100/70 | HR 85 | Temp 97.5°F | Ht 69.0 in | Wt 186.6 lb

## 2024-01-15 DIAGNOSIS — M5442 Lumbago with sciatica, left side: Secondary | ICD-10-CM

## 2024-01-15 DIAGNOSIS — M48062 Spinal stenosis, lumbar region with neurogenic claudication: Secondary | ICD-10-CM | POA: Diagnosis not present

## 2024-01-15 DIAGNOSIS — Z125 Encounter for screening for malignant neoplasm of prostate: Secondary | ICD-10-CM

## 2024-01-15 DIAGNOSIS — M5441 Lumbago with sciatica, right side: Secondary | ICD-10-CM

## 2024-01-15 DIAGNOSIS — Z131 Encounter for screening for diabetes mellitus: Secondary | ICD-10-CM

## 2024-01-15 DIAGNOSIS — F419 Anxiety disorder, unspecified: Secondary | ICD-10-CM

## 2024-01-15 DIAGNOSIS — G8929 Other chronic pain: Secondary | ICD-10-CM

## 2024-01-15 DIAGNOSIS — E663 Overweight: Secondary | ICD-10-CM

## 2024-01-15 DIAGNOSIS — E782 Mixed hyperlipidemia: Secondary | ICD-10-CM | POA: Diagnosis not present

## 2024-01-15 LAB — CBC WITH DIFFERENTIAL/PLATELET
Basophils Absolute: 0 10*3/uL (ref 0.0–0.1)
Basophils Relative: 0.9 % (ref 0.0–3.0)
Eosinophils Absolute: 0.1 10*3/uL (ref 0.0–0.7)
Eosinophils Relative: 1.9 % (ref 0.0–5.0)
HCT: 45.2 % (ref 39.0–52.0)
Hemoglobin: 15.2 g/dL (ref 13.0–17.0)
Lymphocytes Relative: 35.5 % (ref 12.0–46.0)
Lymphs Abs: 1.2 10*3/uL (ref 0.7–4.0)
MCHC: 33.6 g/dL (ref 30.0–36.0)
MCV: 93.5 fl (ref 78.0–100.0)
Monocytes Absolute: 0.5 10*3/uL (ref 0.1–1.0)
Monocytes Relative: 14.7 % — ABNORMAL HIGH (ref 3.0–12.0)
Neutro Abs: 1.6 10*3/uL (ref 1.4–7.7)
Neutrophils Relative %: 47 % (ref 43.0–77.0)
Platelets: 267 10*3/uL (ref 150.0–400.0)
RBC: 4.83 Mil/uL (ref 4.22–5.81)
RDW: 13.6 % (ref 11.5–15.5)
WBC: 3.4 10*3/uL — ABNORMAL LOW (ref 4.0–10.5)

## 2024-01-15 LAB — COMPREHENSIVE METABOLIC PANEL WITH GFR
ALT: 14 U/L (ref 0–53)
AST: 16 U/L (ref 0–37)
Albumin: 4.5 g/dL (ref 3.5–5.2)
Alkaline Phosphatase: 79 U/L (ref 39–117)
BUN: 12 mg/dL (ref 6–23)
CO2: 30 meq/L (ref 19–32)
Calcium: 9.4 mg/dL (ref 8.4–10.5)
Chloride: 103 meq/L (ref 96–112)
Creatinine, Ser: 1.1 mg/dL (ref 0.40–1.50)
GFR: 70.37 mL/min (ref >60.00)
Glucose, Bld: 78 mg/dL (ref 70–99)
Potassium: 4.4 meq/L (ref 3.5–5.1)
Sodium: 137 meq/L (ref 135–145)
Total Bilirubin: 1 mg/dL (ref 0.2–1.2)
Total Protein: 6.7 g/dL (ref 6.0–8.3)

## 2024-01-15 LAB — HEMOGLOBIN A1C: Hgb A1c MFr Bld: 5.6 % (ref 4.6–6.5)

## 2024-01-15 LAB — LIPID PANEL
Cholesterol: 176 mg/dL (ref 0–200)
HDL: 44.5 mg/dL (ref >39.00)
LDL Cholesterol: 97 mg/dL (ref 0–99)
NonHDL: 131.72
Total CHOL/HDL Ratio: 4
Triglycerides: 175 mg/dL — ABNORMAL HIGH (ref 0.0–149.0)
VLDL: 35 mg/dL (ref 0.0–40.0)

## 2024-01-15 MED ORDER — PREDNISONE 20 MG PO TABS: ORAL_TABLET | ORAL | 0 refills | Status: DC

## 2024-01-15 MED ORDER — EZETIMIBE 10 MG PO TABS: 10.0000 mg | ORAL_TABLET | Freq: Every day | ORAL | 3 refills | Status: AC

## 2024-01-15 NOTE — Progress Notes (Signed)
 Phone [phone] In person visit   Subjective:   Troy Christian is a 68 y.o. year old very pleasant male patient who presents for/with See problem oriented charting Chief Complaint  Patient presents with   Back Pain    Pt c/o sciatic nerve and back pain that is getting worse. Has had MRI done in [place] (just got back in town).    Past Medical History-  Patient Active Problem List   Diagnosis Date Noted   Hyperlipidemia     Priority: High   Anxiety 08/07/2020    Priority: Medium    Spinal stenosis of lumbar region with neurogenic claudication 02/27/2018    Priority: Medium    BPH (benign prostatic hyperplasia) 01/07/2017    Priority: Medium    Former smoker 01/07/2017    Priority: Low   Hx of foreign travel 01/07/2017    Priority: Low   Elevated blood pressure reading     Priority: Low    Medications- reviewed and updated Current Outpatient Medications  Medication Sig Dispense Refill   ezetimibe (ZETIA) 10 MG tablet Take 1 tablet (10 mg total) by mouth daily. 90 tablet 3   predniSONE (DELTASONE) 20 MG tablet Take 2 pills for 3 days, 1 pill for 4 days 10 tablet 0   LORazepam (ATIVAN) 0.5 MG tablet Take 1 tablet (0.5 mg total) by mouth daily as needed for anxiety (do not drive for 8 hours after taking). (Patient not taking: Reported on 01/15/2024) 30 tablet 1   No current facility-administered medications for this visit.     Objective:  BP 100/70   Pulse 85   Temp (!) 97.5 F (36.4 C)   Ht 5\' 9"  (1.753 m)   Wt 186 lb 9.6 oz (84.6 kg)   SpO2 98%   BMI 27.56 kg/m  Gen: NAD, resting comfortably CV: RRR no murmurs rubs or gallops Lungs: CTAB no crackles, wheeze, rhonchi Ext: no edema Skin: warm, dry  Back - Normal skin, Spine with normal alignment and no deformity.  No tenderness to vertebral process palpation.  Paraspinous muscles are not tender and without spasm.   Range of motion is full at neck and lumbar sacral regions. Negative Straight leg raise.  Neuro- no  saddle anesthesia, 5/5 strength lower extremities    Assessment and Plan   #social update- was in [place] for a year and then [place] for 5 months  # Back pain with radicular symptoms S: Patient reports sciatic nerve and back pain that is worsening. Worse when wakes up in the morning. Central back pain and radiates into both legs. Worse with standing straight- if leans forward it helps. Wife calls in and reports was at first into left leg. She reports he has a hard time getting moving due to pain -He reports he had an MRI done (saw PCP in [place] and referred to "spinal neurologist" in [place] and just got back in town but still waiting on report to come back  -Patient with history of back pain issues rather severe back in 2019-had spinal stenosis of lumbar spine (saw Dr. Otelia Sergeant) with concern for neurogenic claudication-MRI findings August 2020 showed multilevel lumbar spondylosis with moderate canal stenosis at L3-L4 level.  Stenosis also noted at this level on MRI April 2019 and also at L4-L5 - had been lifting heavier and from standing position but has stopped  - gabapentin caused palpitations. Has tried diclofenac and relieves somewhat.  A/P: Patient with history of lumbar stenosis presenting with midline low back pain  radiating into both legs concerning for worsening of stenosis -Not actively having pain today and exam was completely benign but reports diclofenac has been very helpful but is aware of risks of taking long-term - Pending advanced imaging through [place] evaluation-if he needs surgical or more advanced intervention he would likely return there for treatment - In the meantime he is dealing with pain and is open to trial of physical therapy - We also opted to trial prednisone-she is going to try off the diclofenac as he has been taking this for a few weeks  # Elevated creatinine-he reports one of his kidney test is mildly elevated in [place] but recently improved-I would like to  update his creatinine with labs-he reports he has gained some muscle mass and I wonder if this is contributing to elevation.  Could do Cystatin C if needed  #hyperlipidemia S: Medication: in [place] statin was stopped (myalgias) and he was started on zetia -stopped complaining of questions 10 when stopped statin/rosuvastatin Lab Results  Component Value Date   CHOL 166 02/18/2022   HDL 45.80 02/18/2022   LDLCALC 103 (H) 02/18/2022   TRIG 86.0 02/18/2022   CHOLHDL 4 02/18/2022   A/P: Hopefully lipids improved-I am concerned may be elevated on Zetia alone-recheck with labs as well as CBC and CMP  # Prostate cancer screening PSA and elevation in 2023 but trended back down-reports also looked good in [place]-we will update level today Lab Results  Component Value Date   PSA 1.61 03/18/2022   PSA 2.14 02/18/2022   PSA 1.39 08/21/2021   # Anxiety-patient felt jittery on Lexapro and buspirone with minimal effectiveness.  He stopped buspirone.  Had used very sparing lorazepam in the past and wife reports he has used maybe once a month as he reports about 2 times in the last 5 months-has some leftover from [place]  Recommended follow up: Return in about 6 months (around 07/16/2024) for physical or sooner if needed.Schedule b4 you leave. No future appointments.  Lab/Order associations: NOT fasting   ICD-10-CM   1. Spinal stenosis of lumbar region with neurogenic claudication  M48.062 Ambulatory referral to Physical Therapy    2. Mixed hyperlipidemia  E78.2 Comprehensive metabolic panel with GFR    CBC with Differential/Platelet    Lipid panel    3. Chronic bilateral low back pain with bilateral sciatica  M54.42 Ambulatory referral to Physical Therapy   M54.41 Comprehensive metabolic panel with GFR   G89.29 CBC with Differential/Platelet    4. Screening for prostate cancer  Z12.5 PSA    5. Screening for diabetes mellitus  Z13.1 Hemoglobin A1c    6. Overweight  E66.3 Hemoglobin A1c     7. Anxiety  F41.9       Meds ordered this encounter  Medications   ezetimibe (ZETIA) 10 MG tablet    Sig: Take 1 tablet (10 mg total) by mouth daily.    Dispense:  90 tablet    Refill:  3   predniSONE (DELTASONE) 20 MG tablet    Sig: Take 2 pills for 3 days, 1 pill for 4 days    Dispense:  10 tablet    Refill:  0    Return precautions advised.  Tana Conch, MD

## 2024-01-15 NOTE — Patient Instructions (Addendum)
  Trial prednisone (take in morning) for 7 days to see if that helps pain issue  Schedule physical therapy at the desk- get in as soon as you can- if there is a wait I'm open to using other locations.   Please stop by lab before you go If you have mychart- we will send your results within 3 business days of [place] receiving them.  If you do not have mychart- we will call you about results within 5 business days of [place] receiving them.  *please also note that you will see labs on mychart as soon as they post. I will later go in and write notes on them- will say "notes from Dr. Durene Cal"   Recommended follow up: Return in about 6 months (around 07/16/2024) for physical or sooner if needed.Schedule b4 you leave. But happy to see you sooner if needed for this issue

## 2024-01-16 ENCOUNTER — Encounter: Payer: Self-pay | Admitting: Family Medicine

## 2024-01-16 LAB — PSA: PSA: 1.7 ng/mL (ref 0.10–4.00)

## 2024-01-20 ENCOUNTER — Other Ambulatory Visit: Payer: Self-pay

## 2024-01-20 MED ORDER — LORAZEPAM 0.5 MG PO TABS: 0.5000 mg | ORAL_TABLET | Freq: Every day | ORAL | 1 refills | Status: DC | PRN

## 2024-01-22 ENCOUNTER — Encounter: Payer: Self-pay | Admitting: Physical Therapy

## 2024-01-22 ENCOUNTER — Ambulatory Visit: Admitting: Physical Therapy

## 2024-01-22 DIAGNOSIS — M5459 Other low back pain: Secondary | ICD-10-CM | POA: Diagnosis not present

## 2024-01-22 NOTE — Therapy (Unsigned)
 OUTPATIENT PHYSICAL THERAPY LOWER EXTREMITY EVALUATION   Patient Name: Troy Christian MRN: [id number] DOB:Apr 13, 1956, 68 y.o., male Today's Date: 01/22/2024  END OF SESSION:  PT End of Session - 01/22/24 1217     Visit Number 1    Number of Visits 16    Date for PT Re-Evaluation 03/18/24    Authorization Type BCBS/federal    PT Start Time 1220    PT Stop Time 1300    PT Time Calculation (min) 40 min    Activity Tolerance Patient tolerated treatment well    Behavior During Therapy WFL for tasks assessed/performed             Past Medical History:  Diagnosis Date   Allergy    MILD CURRENTLY   Elevated blood pressure reading    states up with stress.    GERD (gastroesophageal reflux disease)    WITH STRESS - PRN TUMS    Hyperlipidemia    no rx   Past Surgical History:  Procedure Laterality Date   WISDOM TOOTH EXTRACTION     Patient Active Problem List   Diagnosis Date Noted   Anxiety 08/07/2020   Spinal stenosis of lumbar region with neurogenic claudication 02/27/2018   Former smoker 01/07/2017   Hx of foreign travel 01/07/2017   BPH (benign prostatic hyperplasia) 01/07/2017   Hyperlipidemia    Elevated blood pressure reading      PCP: Tana Conch  REFERRING PROVIDER: Tana Conch  REFERRING DIAG: low back pain   THERAPY DIAG:  Other low back pain  Rationale for Evaluation and Treatment: Rehabilitation  ONSET DATE:    SUBJECTIVE:   SUBJECTIVE STATEMENT: Pt states increasing pain in legs, some in low back. He had recent MRI in [place], does not have results yet. He did have previous MRI in 2020 as well. Has not had much pain in back/legs until  more recently. He was lifting weights, still doing some upper body, but not doing heavy or squats like he was. Also reports doing heavy lumbar extension machine. Also changed statin he was on, thinks this has helped with some overall aches. States most pain first thing in the morning, and initial standing. He  just finished a round of prednisone. He works in [place] and travels back and forth a lot, states will be here for a while.   PERTINENT HISTORY: N/a   PAIN:  Are you having pain? Yes: NPRS scale: 3-4/10 Pain location: low back, bil, into bil LEs /back of thighs  Pain description: sore, dull, radiating  Aggravating factors: first thing in am, initial standing,  Relieving factors: fwd bending   PRECAUTIONS: None  WEIGHT BEARING RESTRICTIONS: No  FALLS:  Has patient fallen in last 6 months? No   PLOF: Independent  PATIENT GOALS:  Decreased pain   NEXT MD VISIT:   OBJECTIVE:   DIAGNOSTIC FINDINGS:  Lumbar MRI 2020:  IMPRESSION: 1. Multilevel lumbar spondylosis, most pronounced at the L3-4 level where there is moderate canal stenosis. Overall, no interval progression compared to prior MRI 02/06/2018. 2. Posterior annular fissure at L3-4, which appears new from prior.   PATIENT SURVEYS:   COGNITION: Overall cognitive status: Within functional limits for tasks assessed     SENSATION: WFL  EDEMA: n/a   POSTURE:    No Significant postural limitations  PALPATION:   LOWER EXTREMITY ROM:  Lumbar Flexion: WFL,   Ext: mild/mod limitation/ mild soreness on R,   Hips: WFL  LOWER EXTREMITY MMT:  Hips: 4+/5  LOWER EXTREMITY SPECIAL TESTS:    GAIT:  unremarkable  TODAY'S TREATMENT:                                                                                                                              DATE:  01/22/24 Ther ex: see below for HEP   PATIENT EDUCATION:  Education details: PT POC, Exam findings, HEP Person educated: Patient Education method: Explanation, Demonstration, Tactile cues, Verbal cues, and Handouts Education comprehension: verbalized understanding, returned demonstration, verbal cues required, tactile cues required, and needs further education   HOME EXERCISE PROGRAM: Access Code: [email or web address] URL:  [email or web address] Date: 01/23/2024 Prepared by: Sedalia Muta  Exercises - Single Knee to Chest Stretch  - 2 x daily - 3 reps - 20 hold - Supine Lower Trunk Rotation  - 2 x daily - 10 reps - 5 hold - Child's Pose  - 2 x daily - 3 reps - 30 hold - Cat Cow  - 2 x daily - 2 sets - 10 reps - Seated Hamstring Stretch  - 2 x daily - 3 reps - 30 hold - Seated Lumbar Flexion Stretch  - 2 x daily - 3 reps - 20 hold  ASSESSMENT:  CLINICAL IMPRESSION: Patient presents with primary complaint of  pain in low back and into bil LEs. He has only mild ROM limitations. He has decreased stability in core and will benefit from HEP for this as well as education on flexion bias movement. He has decreased ability for full functional activities. Pt will  benefit from skilled PT to improve deficits and pain and to return to PLOF.   OBJECTIVE IMPAIRMENTS: decreased activity tolerance, decreased mobility, decreased ROM, increased muscle spasms, improper body mechanics, and pain.   ACTIVITY LIMITATIONS: lifting, bending, standing, squatting, sleeping, transfers, and locomotion level  PARTICIPATION LIMITATIONS: meal prep, cleaning, driving, shopping, community activity, and yard work  PERSONAL FACTORS: Past/current experiences and Time since onset of injury/illness/exacerbation are also affecting patient's functional outcome.   REHAB POTENTIAL: Good  CLINICAL DECISION MAKING: Stable/uncomplicated  EVALUATION COMPLEXITY: Low   GOALS: Goals reviewed with patient? Yes  SHORT TERM GOALS: Target date: 02/12/2024   Pt to be independent with initial HEP  Goal status: INITIAL  2.  Pt to report decreased pain with transfers to 0-3/10  Goal status: INITIAL   LONG TERM GOALS: Target date: 03/18/2024   Pt to be independent with final HEP  Goal status: INITIAL  2.  Pt to report decreased pain in back and LEs to 0-2/10 with initial standing and in AM.   Goal status: INITIAL  3.  Pt to  report at least 75 % improvement in overall symptoms.   Goal status: INITIAL   PLAN:  PT FREQUENCY: 1-2x/week  PT DURATION: 8 weeks  PLANNED INTERVENTIONS: Therapeutic exercises, Therapeutic activity, Neuromuscular re-education, Patient/Family education, Self Care, Joint mobilization, Joint manipulation, Stair training, Orthotic/Fit training, DME instructions, Aquatic Therapy, Dry Needling, Electrical stimulation, Cryotherapy, Moist  heat, Taping, Ultrasound, Ionotophoresis 4mg /ml Dexamethasone, Manual therapy,  Vasopneumatic device, Traction, Spinal manipulation, Spinal mobilization,Balance training, Gait training,   PLAN FOR NEXT SESSION:    Sedalia Muta, PT, DPT 8:19 AM  01/23/24

## 2024-02-05 ENCOUNTER — Encounter: Admitting: Physical Therapy

## 2024-02-12 ENCOUNTER — Ambulatory Visit (INDEPENDENT_AMBULATORY_CARE_PROVIDER_SITE_OTHER): Admitting: Physical Therapy

## 2024-02-12 ENCOUNTER — Encounter: Payer: Self-pay | Admitting: Physical Therapy

## 2024-02-12 ENCOUNTER — Telehealth: Payer: Self-pay | Admitting: Family Medicine

## 2024-02-12 DIAGNOSIS — M5459 Other low back pain: Secondary | ICD-10-CM

## 2024-02-12 NOTE — Telephone Encounter (Signed)
 He had an MRI in [place] per his report-did they ever get us  a copy of the records?

## 2024-02-12 NOTE — Telephone Encounter (Signed)
 Patient saw Physical therapy today and therapist suggest an MRI . Patient would like to have referral placed . Please advise

## 2024-02-12 NOTE — Telephone Encounter (Signed)
 Looks like pt had therapy with Lauren today, not sure if she sent you a separate note regarding this.

## 2024-02-12 NOTE — Therapy (Addendum)
 " OUTPATIENT PHYSICAL THERAPY LOWER EXTREMITYTREATMENT   Patient Name: Troy Christian MRN: [id number] DOB:12-09-1955, 68 y.o., male Today's Date: 02/12/2024  END OF SESSION:  PT End of Session - 02/12/24 1001     Visit Number 2    Number of Visits 16    Date for PT Re-Evaluation 03/18/24    Authorization Type BCBS/federal    PT Start Time 1003    PT Stop Time 1050    PT Time Calculation (min) 47 min    Activity Tolerance Patient tolerated treatment well    Behavior During Therapy WFL for tasks assessed/performed             Past Medical History:  Diagnosis Date   Allergy    MILD CURRENTLY   Elevated blood pressure reading    states up with stress.    GERD (gastroesophageal reflux disease)    WITH STRESS - PRN TUMS    Hyperlipidemia    no rx   Past Surgical History:  Procedure Laterality Date   WISDOM TOOTH EXTRACTION     Patient Active Problem List   Diagnosis Date Noted   Anxiety 08/07/2020   Spinal stenosis of lumbar region with neurogenic claudication 02/27/2018   Former smoker 01/07/2017   Hx of foreign travel 01/07/2017   BPH (benign prostatic hyperplasia) 01/07/2017   Hyperlipidemia    Elevated blood pressure reading      PCP: Garnette Lukes  REFERRING PROVIDER: Garnette Lukes  REFERRING DIAG: low back pain   THERAPY DIAG:  Other low back pain  Rationale for Evaluation and Treatment: Rehabilitation  ONSET DATE:    SUBJECTIVE:   SUBJECTIVE STATEMENT: Pt last seen 4/10. Still having pain and most pain with standing and walking. Flexion is relieving for him. Still has been able to work out some and swim (breast stroke). Feels his pain is still very bothersome.   Eval: Pt states increasing pain in legs, some in low back. He had recent MRI in [place], does not have results yet. He did have previous MRI in 2020 as well. Has not had much pain in back/legs until  more recently. He was lifting weights, still doing some upper body, but not doing heavy or  squats like he was. Also reports doing heavy lumbar extension machine. Also changed statin he was on, thinks this has helped with some overall aches. States most pain first thing in the morning, and initial standing. He just finished a round of prednisone . He works in [place] and travels back and forth a lot, states will be here for a while.   PERTINENT HISTORY: N/a   PAIN:  Are you having pain? Yes: NPRS scale: 3-4/10 Pain location: low back, bil, into bil LEs /back of thighs  Pain description: sore, dull, radiating  Aggravating factors: first thing in am, initial standing,  Relieving factors: fwd bending   PRECAUTIONS: None  WEIGHT BEARING RESTRICTIONS: No  FALLS:  Has patient fallen in last 6 months? No   PLOF: Independent  PATIENT GOALS:  Decreased pain   NEXT MD VISIT:   OBJECTIVE:   DIAGNOSTIC FINDINGS:  Lumbar MRI 2020:  IMPRESSION: 1. Multilevel lumbar spondylosis, most pronounced at the L3-4 level where there is moderate canal stenosis. Overall, no interval progression compared to prior MRI 02/06/2018. 2. Posterior annular fissure at L3-4, which appears new from prior.   PATIENT SURVEYS:   COGNITION: Overall cognitive status: Within functional limits for tasks assessed     SENSATION: WFL  EDEMA: n/a  POSTURE:    No Significant postural limitations  PALPATION:   LOWER EXTREMITY ROM:  Lumbar Flexion: WFL,   Ext: mild/mod limitation/ mild soreness on R,   Hips: WFL  LOWER EXTREMITY MMT:  Hips: 4+/5  LOWER EXTREMITY SPECIAL TESTS:    GAIT:  unremarkable  TODAY'S TREATMENT:                                                                                                                              DATE:   02/12/2024 Therapeutic Exercise: Aerobic: bike L 2 x 8 min;  Supine:   SLR 2 x 5 bil (challenging);  TA x 5;  Supine march with TA x 15;  Pelvic tilts 2 x 10;  Seated: Standing: Stretches:   DKTC 30 sec x 2;  hamstring/seated (pt states  soreness in back),   review for Cat cow (x 5) and childs pose for HEP;   Neuromuscular Re-education: Manual Therapy: long leg distraction for bil lumbar ;  Therapeutic Activity: Self Care:   PATIENT EDUCATION:  Education details: updated and reviewed HEP Person educated: Patient Education method: Explanation, Demonstration, Tactile cues, Verbal cues, and Handouts Education comprehension: verbalized understanding, returned demonstration, verbal cues required, tactile cues required, and needs further education   HOME EXERCISE PROGRAM: Access Code: [email or web address] URL: [email or web address] Date: 01/23/2024 Prepared by: Tinnie Don  Exercises - Single Knee to Chest Stretch  - 2 x daily - 3 reps - 20 hold - Supine Lower Trunk Rotation  - 2 x daily - 10 reps - 5 hold - Child's Pose  - 2 x daily - 3 reps - 30 hold - Cat Cow  - 2 x daily - 2 sets - 10 reps - Seated Hamstring Stretch  - 2 x daily - 3 reps - 30 hold - Seated Lumbar Flexion Stretch  - 2 x daily - 3 reps - 20 hold  ASSESSMENT:  CLINICAL IMPRESSION: Pt continues to state more comfort in flexion. He has mild increase in pain today with standing fwd flexion and hamstirng stretch (pain in back). But most pain is with standing, seen only after a few minutes of standing today. Continued education on back position, and continuing HEP for flexion frequently throughout the day. Due to continued pain, and pts questions about future process, recommended he f/u with ortho as needed. It has been several years since he saw them in the past. Recommended pt continue PT, with increased frequency of 1x/wk. Updated and reviewed HEP today. Pt to benefit from more education on standing posture/anterior pelvic tilt.   Eval: Patient presents with primary complaint of  pain in low back and into bil LEs. He has only mild ROM limitations. He has decreased stability in core and will benefit from HEP for this as well as education on flexion bias  movement. He has decreased ability for full functional activities. Pt will  benefit from skilled PT to improve deficits and pain and to return  to PLOF.   OBJECTIVE IMPAIRMENTS: decreased activity tolerance, decreased mobility, decreased ROM, increased muscle spasms, improper body mechanics, and pain.   ACTIVITY LIMITATIONS: lifting, bending, standing, squatting, sleeping, transfers, and locomotion level  PARTICIPATION LIMITATIONS: meal prep, cleaning, driving, shopping, community activity, and yard work  PERSONAL FACTORS: Past/current experiences and Time since onset of injury/illness/exacerbation are also affecting patient's functional outcome.   REHAB POTENTIAL: Good  CLINICAL DECISION MAKING: Stable/uncomplicated  EVALUATION COMPLEXITY: Low   GOALS: Goals reviewed with patient? Yes  SHORT TERM GOALS: Target date: 02/12/2024   Pt to be independent with initial HEP  Goal status: MET  2.  Pt to report decreased pain with transfers to 0-3/10  Goal status: INITIAL   LONG TERM GOALS: Target date: 03/18/2024   Pt to be independent with final HEP  Goal status: INITIAL  2.  Pt to report decreased pain in back and LEs to 0-2/10 with initial standing and in AM.   Goal status: INITIAL  3.  Pt to report at least 75 % improvement in overall symptoms.   Goal status: INITIAL   PLAN:  PT FREQUENCY: 1-2x/week  PT DURATION: 8 weeks  PLANNED INTERVENTIONS: Therapeutic exercises, Therapeutic activity, Neuromuscular re-education, Patient/Family education, Self Care, Joint mobilization, Joint manipulation, Stair training, Orthotic/Fit training, DME instructions, Aquatic Therapy, Dry Needling, Electrical stimulation, Cryotherapy, Moist heat, Taping, Ultrasound, Ionotophoresis 4mg /ml Dexamethasone , Manual therapy,  Vasopneumatic device, Traction, Spinal manipulation, Spinal mobilization,Balance training, Gait training,   PLAN FOR NEXT SESSION:  bike, flexion, pelvic tilts, standing  posture, Core strength.    Tinnie Don, PT, DPT 11:56 AM  02/12/24   PHYSICAL THERAPY DISCHARGE SUMMARY  Visits from Start of Care:  2   Plan: Patient agrees to discharge.  Patient goals were partially met. Patient is being discharged due to - pt did not return after last visit   Tinnie Don, PT, DPT 11:30 AM  11/09/24     "

## 2024-02-13 NOTE — Telephone Encounter (Signed)
Called and lm for pt tcb. 

## 2024-02-18 ENCOUNTER — Other Ambulatory Visit: Payer: Self-pay

## 2024-02-18 ENCOUNTER — Encounter: Payer: Self-pay | Admitting: Family Medicine

## 2024-02-18 DIAGNOSIS — M069 Rheumatoid arthritis, unspecified: Secondary | ICD-10-CM

## 2024-02-18 DIAGNOSIS — M47816 Spondylosis without myelopathy or radiculopathy, lumbar region: Secondary | ICD-10-CM

## 2024-02-18 NOTE — Telephone Encounter (Signed)
 See below

## 2024-02-19 ENCOUNTER — Encounter: Admitting: Physical Therapy

## 2024-02-26 ENCOUNTER — Encounter: Admitting: Physical Therapy

## 2024-03-25 ENCOUNTER — Other Ambulatory Visit (INDEPENDENT_AMBULATORY_CARE_PROVIDER_SITE_OTHER): Payer: Self-pay

## 2024-03-25 ENCOUNTER — Ambulatory Visit: Admitting: Orthopedic Surgery

## 2024-03-25 VITALS — BP 118/78 | HR 60 | Ht 69.0 in | Wt 187.0 lb

## 2024-03-25 DIAGNOSIS — M5416 Radiculopathy, lumbar region: Secondary | ICD-10-CM | POA: Diagnosis not present

## 2024-03-25 DIAGNOSIS — M545 Low back pain, unspecified: Secondary | ICD-10-CM | POA: Diagnosis not present

## 2024-03-25 NOTE — Progress Notes (Addendum)
 Orthopedic Spine Surgery Office Note  Assessment: Patient is a 68 y.o. male with low back pain and bilateral leg pain, suspect radiculopathy   Plan: -Explained that initially conservative treatment is tried as a significant number of patients may experience relief with these treatment modalities. Discussed that the conservative treatments include:  -activity modification  -physical therapy  -over the counter pain medications  -medrol  dosepak  -lumbar steroid injections -Patient has tried PT, Tylenol , prednisone  -Recommended diagnostic/therapeutic L4/5 injection.  Referral provided to him today -Can use tylenol  up to 1000mg  TID -If he is not doing any better at our next visit, we will order an MRI of the lumbar spine to evaluate further -Patient should return to office in 5-6 weeks, x-rays at next visit: none   Patient expressed understanding of the plan and all questions were answered to the patient's satisfaction.   ___________________________________________________________________________   History:  Patient is a 68 y.o. male who presents today for lumbar spine.  Patient has had about a year and a half of low back pain that radiates into his bilateral lower extremities.  He noticed it when he was in [place].  He then went to the [place] and had done some treatment there.  He has not noticed any improvement in his pain since he for started noticing it.  He notices the pain with activity and at rest.  Feels the pain radiating down the posterior aspect of his thighs and legs.  There is no trauma or injury that preceded the onset of the pain.   Weakness: Denies Symptoms of imbalance: Denies Paresthesias and numbness: Denies Bowel or bladder incontinence: Denies Saddle anesthesia: Denies  Treatments tried: PT, Tylenol , prednisone   Review of systems: Denies fevers and chills, night sweats, unexplained weight loss, history of cancer, pain that wakes them at night  Past medical  history: GERD HLD  Allergies: penicillin  Past surgical history:  None  Social history: Denies use of nicotine product (smoking, vaping, patches, smokeless) Alcohol use: rare Denies recreational drug use   Physical Exam:  BMI of 27.6  General: no acute distress, appears stated age Neurologic: alert, answering questions appropriately, following commands Respiratory: unlabored breathing on room air, symmetric chest rise Psychiatric: appropriate affect, normal cadence to speech   MSK (spine):  -Strength exam      Left  Right EHL    5/5  5/5 TA    5/5  5/5 GSC    5/5  5/5 Knee extension  5/5  5/5 Hip flexion   5/5  5/5  -Sensory exam    Sensation intact to light touch in L3-S1 nerve distributions of bilateral lower extremities  -Achilles DTR: 2/4 on the left, 2/4 on the right -Patellar tendon DTR: 2/4 on the left, 2/4 on the right  -Straight leg raise: Negative bilaterally -Clonus: no beats bilaterally  -Left hip exam: No pain through range of motion -Right hip exam: No pain through range of motion  Imaging: XRs of the lumbar spine from 03/25/2024 were independently reviewed and interpreted, showing grade 1 spondylolisthesis at L4/5. No significant disc height loss. No fracture or dislocation seen.    Patient name: Troy Christian Patient MRN: [id number] Date of visit: 03/25/24

## 2024-03-29 ENCOUNTER — Encounter: Payer: Self-pay | Admitting: Orthopedic Surgery

## 2024-03-29 NOTE — Addendum Note (Signed)
 Addended by: Colette Davies on: 03/29/2024 08:19 AM   Modules accepted: Orders

## 2024-04-05 NOTE — Discharge Instructions (Signed)

## 2024-04-06 ENCOUNTER — Ambulatory Visit
Admission: RE | Admit: 2024-04-06 | Discharge: 2024-04-06 | Disposition: A | Discharge status: Home / Self Care | Source: Ambulatory Visit | Attending: Orthopedic Surgery

## 2024-04-06 DIAGNOSIS — M5416 Radiculopathy, lumbar region: Secondary | ICD-10-CM

## 2024-04-06 MED ORDER — IOPAMIDOL (ISOVUE-M 200) INJECTION 41%
1.0000 mL | Freq: Once | INTRAMUSCULAR | Status: AC
Start: 2024-04-06 — End: 2024-04-06
  Administered 2024-04-06: 1 mL via EPIDURAL

## 2024-04-06 MED ORDER — METHYLPREDNISOLONE ACETATE 40 MG/ML INJ SUSP (RADIOLOG
80.0000 mg | Freq: Once | INTRAMUSCULAR | Status: AC
  Administered 2024-04-06: 80 mg via EPIDURAL

## 2024-05-14 ENCOUNTER — Encounter: Payer: Self-pay | Admitting: Orthopedic Surgery

## 2024-05-19 ENCOUNTER — Ambulatory Visit (INDEPENDENT_AMBULATORY_CARE_PROVIDER_SITE_OTHER): Admitting: Orthopedic Surgery

## 2024-05-19 DIAGNOSIS — M5416 Radiculopathy, lumbar region: Secondary | ICD-10-CM

## 2024-05-19 MED ORDER — METHYLPREDNISOLONE 4 MG PO TBPK: ORAL_TABLET | ORAL | 0 refills | Status: DC

## 2024-05-19 NOTE — Progress Notes (Signed)
 Orthopedic Spine Surgery Office Note   Assessment: Patient is a 68 y.o. male with low back pain and bilateral leg pain, suspect radiculopathy     Plan: -Patient has tried PT, Tylenol , prednisone , lumbar steroid injection -Since patient has not gotten any better over the last 8 weeks with conservative treatment, recommend an MRI of the lumbar spine to evaluate for radiculopathy -Prescribed a medrol  dose pak to help with the worsening pain -Patient should return to office in 4 weeks, x-rays at next visit: none     Patient expressed understanding of the plan and all questions were answered to the patient's satisfaction.    ___________________________________________________________________________     History:   Patient is a 68 y.o. male who presents today for follow-up on his lumbar spine.  Chronic low back pain that radiates into the posterior aspect of his thighs and legs.  His symptoms have gotten acutely worse since he was last seen.  He has had he has to stand in unusual positions in order to accommodate the pain.  He feels the pain on daily basis.  He notes the pain with activity and at rest.   Treatments tried: PT, Tylenol , prednisone , lumbar steroid injection    Physical Exam:   General: no acute distress, appears stated age Neurologic: alert, answering questions appropriately, following commands Respiratory: unlabored breathing on room air, symmetric chest rise Psychiatric: appropriate affect, normal cadence to speech     MSK (spine):   -Strength exam                                                   Left                  Right EHL                              5/5                  5/5 TA                                 5/5                  5/5 GSC                             5/5                  5/5 Knee extension            5/5                  5/5 Hip flexion                    5/5                  5/5   -Sensory exam                           Sensation intact to  light touch in L3-S1 nerve distributions of bilateral lower extremities   Imaging: XRs of the lumbar spine from 03/25/2024 were previously independently  reviewed and interpreted, showing grade 1 spondylolisthesis at L4/5. No significant disc height loss. No fracture or dislocation seen.      Patient name: Troy Christian Patient MRN: [id number] Date of visit: 05/19/24

## 2024-05-20 ENCOUNTER — Telehealth: Payer: Self-pay | Admitting: *Deleted

## 2024-05-20 ENCOUNTER — Telehealth: Payer: Self-pay | Admitting: Orthopedic Surgery

## 2024-05-20 NOTE — Telephone Encounter (Signed)
 Pt called requesting an open MRI. Please resend MRI referral to [facility]. Please call pt about this matter at [phone].

## 2024-05-20 NOTE — Telephone Encounter (Signed)
 Copied from CRM [id number]. Topic: Clinical - Lab/Test Results >> May 20, 2024  8:16 AM Emylou G wrote: Reason for CRM: Patient is interested in getting blood type?  Does this req labs?  Pls call him.  Patient notified,we Do not do blood type testing.  Verbalized understanding  Maxamillion Banas,RMA

## 2024-05-20 NOTE — Addendum Note (Signed)
 Addended by: GEORGINA SHARPER on: 05/20/2024 11:25 AM   Modules accepted: Orders

## 2024-05-28 ENCOUNTER — Ambulatory Visit (HOSPITAL_COMMUNITY)
Admission: RE | Admit: 2024-05-28 | Discharge: 2024-05-28 | Disposition: A | Discharge status: Home / Self Care | Source: Ambulatory Visit | Attending: Orthopedic Surgery | Admitting: Orthopedic Surgery

## 2024-05-28 ENCOUNTER — Encounter: Payer: Self-pay | Admitting: Orthopedic Surgery

## 2024-05-28 DIAGNOSIS — M5416 Radiculopathy, lumbar region: Secondary | ICD-10-CM

## 2024-06-03 ENCOUNTER — Telehealth: Payer: Self-pay | Admitting: Orthopedic Surgery

## 2024-06-03 NOTE — Telephone Encounter (Signed)
 Patient called. Would like a later time for 9/4.

## 2024-06-17 ENCOUNTER — Ambulatory Visit: Admitting: Orthopedic Surgery

## 2024-06-21 ENCOUNTER — Ambulatory Visit

## 2024-06-21 ENCOUNTER — Ambulatory Visit (INDEPENDENT_AMBULATORY_CARE_PROVIDER_SITE_OTHER)

## 2024-06-21 VITALS — Ht 69.0 in | Wt 183.0 lb

## 2024-06-21 DIAGNOSIS — Z0000 Encounter for general adult medical examination without abnormal findings: Secondary | ICD-10-CM

## 2024-06-21 NOTE — Progress Notes (Signed)
 Subjective:   Troy Christian is a 68 y.o. who presents for a Medicare Wellness preventive visit.  As a reminder, Annual Wellness Visits don't include a physical exam, and some assessments may be limited, especially if this visit is performed virtually. We may recommend an in-person follow-up visit with your provider if needed.  Visit Complete: Virtual I connected with  Troy Christian on 06/21/24 by a audio enabled telemedicine application and verified that I am speaking with the correct person using two identifiers.  Patient Location: Home  Provider Location: Office/Clinic  I discussed the limitations of evaluation and management by telemedicine. The patient expressed understanding and agreed to proceed.  Vital Signs: Because this visit was a virtual/telehealth visit, some criteria may be missing or patient reported. Any vitals not documented were not able to be obtained and vitals that have been documented are patient reported.  VideoDeclined- This patient declined Librarian, academic. Therefore the visit was completed with audio only.  Persons Participating in Visit: Patient.  AWV Questionnaire: Yes: Patient Medicare AWV questionnaire was completed by the patient on 06/20/24; I have confirmed that all information answered by patient is correct and no changes since this date.  Cardiac Risk Factors include: advanced age (>44men, >46 women);dyslipidemia;male gender     Objective:    Today's Vitals   06/20/24 1438 06/21/24 1348  Weight:  183 lb (83 kg)  Height:  5' 9 (1.753 m)  PainSc: 8     Body mass index is 27.02 kg/m.     06/21/2024    1:53 PM 01/22/2024   12:16 PM 09/11/2017    7:45 PM  Advanced Directives  Does Patient Have a Medical Advance Directive? No No No   Would patient like information on creating a medical advance directive? No - Patient declined No - Patient declined No - Patient declined      Data saved with a previous flowsheet row  definition    Current Medications (verified) Outpatient Encounter Medications as of 06/21/2024  Medication Sig   ezetimibe  (ZETIA ) 10 MG tablet Take 1 tablet (10 mg total) by mouth daily.   LORazepam  (ATIVAN ) 0.5 MG tablet Take 1 tablet (0.5 mg total) by mouth daily as needed for anxiety (do not drive for 8 hours after taking).   [DISCONTINUED] methylPREDNISolone  (MEDROL  DOSEPAK) 4 MG TBPK tablet Take as prescribed on the box   No facility-administered encounter medications on file as of 06/21/2024.    Allergies (verified) Penicillins   History: Past Medical History:  Diagnosis Date   Allergy    MILD CURRENTLY   Elevated blood pressure reading    states up with stress.    GERD (gastroesophageal reflux disease)    WITH STRESS - PRN TUMS    Hyperlipidemia    no rx   Past Surgical History:  Procedure Laterality Date   WISDOM TOOTH EXTRACTION     Family History  Problem Relation Age of Onset   Hypertension Mother    Pneumonia Mother        lived to 35   Hypertension Father    Prostate cancer Father        lived to 45   Asthma Sister        due to asthma   Prostate cancer Brother        he is a gynecologist in [place]   CAD Brother        suddent cardiac arrest at age 43   Colon cancer Neg  Hx    Colon polyps Neg Hx    Esophageal cancer Neg Hx    Rectal cancer Neg Hx    Stomach cancer Neg Hx    Social History   Socioeconomic History   Marital status: Single    Spouse name: Not on file   Number of children: Not on file   Years of education: Not on file   Highest education level: Doctorate  Occupational History   Not on file  Tobacco Use   Smoking status: Former    Current packs/day: 0.00    Average packs/day: 0.4 packs/day for 20.0 years (8.0 ttl pk-yrs)    Types: Pipe, Cigars, Cigarettes    Start date: 10/14/1988    Quit date: 10/14/2008    Years since quitting: 15.6   Smokeless tobacco: Never  Substance and Sexual Activity   Alcohol use: Yes    Comment:  occasionally   Drug use: No   Sexual activity: Yes  Other Topics Concern   Not on file  Social History Narrative   Lives alone here. . Married around 2003- wife lives in [place]- she will move here- critical care nurse   From [place]. Has been to 75 countries.       Retired around 58. 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, still works sometimes   Social Drivers of Corporate investment banker Strain: Patient Declined (06/20/2024)   Overall Financial Resource Strain (CARDIA)    Difficulty of Paying Living Expenses: Patient declined  Food Insecurity: Unknown (06/20/2024)   Hunger Vital Sign    Worried About Running Out of Food in the Last Year: Never true    Ran Out of Food in the Last Year: Patient declined  Transportation Needs: No Transportation Needs (06/20/2024)   PRAPARE - Administrator, Civil Service (Medical): No    Lack of Transportation (Non-Medical): No  Physical Activity: Sufficiently Active (06/20/2024)   Exercise Vital Sign    Days of Exercise per Week: 5 days    Minutes of Exercise per Session: 60 min  Stress: Patient Declined (06/20/2024)   [employer] of Occupational Health - Occupational Stress Questionnaire    Feeling of Stress: Patient declined  Social Connections: Unknown (06/20/2024)   Social Connection and Isolation Panel    Frequency of Communication with Friends and Family: More than three times a week    Frequency of Social Gatherings with Friends and Family: Patient declined    Attends Religious Services: More than 4 times per year    Active Member of [employer] or Organizations: Yes    Attends Engineer, structural: More than 4 times per year    Marital Status: Patient declined    Tobacco Counseling Counseling given: Not Answered    Clinical  Intake:  Pre-visit preparation completed: Yes  Pain : 0-10 Pain Score: 8  Pain Type: Chronic pain Pain Location: Back Pain Orientation: Lower Pain Descriptors / Indicators: Aching Pain Onset: More than a month ago Pain Frequency: Constant     BMI - recorded: 27.02 Diabetes: No  Lab Results  Component Value Date   HGBA1C 5.6 01/15/2024     How often do you need to have someone help you when you read instructions, pamphlets, or other written materials from your  doctor or pharmacy?: 1 - Never  Interpreter Needed?: No  Information entered by :: Ellouise Haws, LPN   Activities of Daily Living     06/20/2024    2:38 PM  In your present state of health, do you have any difficulty performing the following activities:  Hearing? 0  Vision? 0  Difficulty concentrating or making decisions? 0  Walking or climbing stairs? 0  Dressing or bathing? 0  Doing errands, shopping? 0  Preparing Food and eating ? N  Using the Toilet? N  In the past six months, have you accidently leaked urine? Y  Comment drip at times  Do you have problems with loss of bowel control? N  Managing your Medications? N  Managing your Finances? N  Housekeeping or managing your Housekeeping? N    Patient Care Team: Katrinka Garnette KIDD, MD as PCP - General (Family Medicine)  I have updated your Care Teams any recent Medical Services you may have received from other providers in the past year.     Assessment:   This is a routine wellness examination for Geraldine.  Hearing/Vision screen Hearing Screening - Comments:: Pt denies any hearing issues  Vision Screening - Comments:: Pt has to find provider in [place] last had in feb in [place]    Goals Addressed             This Visit's Progress    Patient Stated       Maintain health and activity        Depression Screen     06/21/2024    1:53 PM 01/15/2024   10:08 AM 08/21/2021   10:37 AM 11/17/2020    1:20 PM 07/06/2020    3:42 PM 01/20/2018   10:41 AM  PHQ  2/9 Scores  PHQ - 2 Score 0 0 0 0 0 1  PHQ- 9 Score  0        Fall Risk     06/20/2024    2:38 PM 01/15/2024   10:06 AM 02/19/2022   12:56 PM 11/17/2020    1:20 PM 01/20/2018   10:41 AM  Fall Risk   Falls in the past year? 0 0 0 0 No   Number falls in past yr: 0 0 0 0   Injury with Fall? 0 0 0 0   Risk for fall due to : No Fall Risks No Fall Risks No Fall Risks    Follow up Falls prevention discussed Falls evaluation completed Falls evaluation completed        Data saved with a previous flowsheet row definition    MEDICARE RISK AT HOME:  Medicare Risk at Home Any stairs in or around the home?: (Patient-Rptd) No Home free of loose throw rugs in walkways, pet beds, electrical cords, etc?: (Patient-Rptd) Yes Adequate lighting in your home to reduce risk of falls?: (Patient-Rptd) Yes Life alert?: (Patient-Rptd) No Use of a cane, walker or w/c?: (Patient-Rptd) No Grab bars in the bathroom?: No Shower chair or bench in shower?: (Patient-Rptd) Yes Elevated toilet seat or a handicapped toilet?: No  TIMED UP AND GO:  Was the test performed?  No  Cognitive Function: 6CIT completed        06/21/2024    1:55 PM  6CIT Screen  What Year? 0 points  What month? 0 points  What time? 0 points  Count back from 20 0 points  Months in reverse 0 points  Repeat phrase 0 points  Total Score 0 points  Immunizations Immunization History  Administered Date(s) Administered   Influenza,inj,Quad PF,6+ Mos 06/28/2019, 07/06/2020, 08/21/2021   Influenza-Unspecified 08/28/2016, 08/12/2017   PFIZER(Purple Top)SARS-COV-2 Vaccination 11/17/2019, 02/01/2020, 09/16/2020   Tdap 07/06/2020    Screening Tests Health Maintenance  Topic Date Due   Pneumococcal Vaccine: 50+ Years (1 of 1 - PCV) Never done   Zoster Vaccines- Shingrix (1 of 2) Never done   Influenza Vaccine  05/14/2024   COVID-19 Vaccine (4 - 2025-26 season) 06/14/2024   Medicare Annual Wellness (AWV)  06/21/2025   Colonoscopy   12/21/2027   DTaP/Tdap/Td (2 - Td or Tdap) 07/06/2030   Hepatitis C Screening  Completed   HPV VACCINES  Aged Out   Meningococcal B Vaccine  Aged Out    Health Maintenance Items Addressed: See Nurse Notes at the end of this note  Additional Screening:  Vision Screening: Recommended annual ophthalmology exams for early detection of glaucoma and other disorders of the eye. Is the patient up to date with their annual eye exam?  Yes  Who is the provider or what is the name of the office in which the patient attends annual eye exams? Will have to find a provider  last Has appt in [place]   Dental Screening: Recommended annual dental exams for proper oral hygiene  Community Resource Referral / Chronic Care Management: CRR required this visit?  No   CCM required this visit?  No   Plan:    I have personally reviewed and noted the following in the patient's chart:   Medical and social history Use of alcohol, tobacco or illicit drugs  Current medications and supplements including opioid prescriptions. Patient is not currently taking opioid prescriptions. Functional ability and status Nutritional status Physical activity Advanced directives List of other physicians Hospitalizations, surgeries, and ER visits in previous 12 months Vitals Screenings to include cognitive, depression, and falls Referrals and appointments  In addition, I have reviewed and discussed with patient certain preventive protocols, quality metrics, and best practice recommendations. A written personalized care plan for preventive services as well as general preventive health recommendations were provided to patient.   Ellouise VEAR Haws, LPN   [id number]   After Visit Summary: (MyChart) Due to this being a telephonic visit, the after visit summary with patients personalized plan was offered to patient via MyChart   Notes: Nothing significant to report at this time.

## 2024-06-21 NOTE — Patient Instructions (Signed)
 Dr. Sheppard,  Thank you for taking the time for your Medicare Wellness Visit. I appreciate your continued commitment to your health goals. Please review the care plan we discussed, and feel free to reach out if I can assist you further.  Medicare recommends these wellness visits once per year to help you and your care team stay ahead of potential health issues. These visits are designed to focus on prevention, allowing your provider to concentrate on managing your acute and chronic conditions during your regular appointments.  Please note that Annual Wellness Visits do not include a physical exam. Some assessments may be limited, especially if the visit was conducted virtually. If needed, we may recommend a separate in-person follow-up with your provider.  Ongoing Care Seeing your primary care provider every 3 to 6 months helps us  monitor your health and provide consistent, personalized care.   Referrals If a referral was made during today's visit and you haven't received any updates within two weeks, please contact the referred provider directly to check on the status.  Recommended Screenings:  Health Maintenance  Topic Date Due   Medicare Annual Wellness Visit  Never done   Pneumococcal Vaccine for age over 63 (1 of 1 - PCV) Never done   Zoster (Shingles) Vaccine (1 of 2) Never done   Flu Shot  05/14/2024   COVID-19 Vaccine (4 - 2025-26 season) 06/14/2024   Colon Cancer Screening  12/21/2027   DTaP/Tdap/Td vaccine (2 - Td or Tdap) 07/06/2030   Hepatitis C Screening  Completed   HPV Vaccine  Aged Out   Meningitis B Vaccine  Aged Out       06/21/2024    1:53 PM  Advanced Directives  Does Patient Have a Medical Advance Directive? No  Would patient like information on creating a medical advance directive? No - Patient declined   Advance Care Planning is important because it: Ensures you receive medical care that aligns with your values, goals, and preferences. Provides guidance to your  family and loved ones, reducing the emotional burden of decision-making during critical moments.  Vision: Annual vision screenings are recommended for early detection of glaucoma, cataracts, and diabetic retinopathy. These exams can also reveal signs of chronic conditions such as diabetes and high blood pressure.  Dental: Annual dental screenings help detect early signs of oral cancer, gum disease, and other conditions linked to overall health, including heart disease and diabetes.  Please see the attached documents for additional preventive care recommendations.

## 2024-06-28 ENCOUNTER — Telehealth: Payer: Self-pay | Admitting: Orthopedic Surgery

## 2024-06-28 NOTE — Telephone Encounter (Addendum)
 Orthopedic Note  Patient continues to have significant back pain radiating into his bilateral lower extremities.  He has had the symptoms for about 2 years now.  He first saw me on 03/25/2024.  I initiated treatment at that time and he has been doing conservative treatments since that time. 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  The conservative treatments that he had tried to date consisted of PT, Tylenol , prednisone , lumbar steroid injection.  He has not noticed any relief with these conservative treatments, so discussed surgery as an option for him in the form of L4 and L5 segment laminectomies with partial medial facetectomies. The risks, benefits, alternatives of this treatment were covered with the patient. Patient wanted to proceed with that treatment.  I will work to get him on the OR schedule at a time that is mutually convenient.  Ozell DELENA Ada, MD Orthopedic Surgeon

## 2024-06-29 ENCOUNTER — Ambulatory Visit (INDEPENDENT_AMBULATORY_CARE_PROVIDER_SITE_OTHER): Admitting: Physical Medicine and Rehabilitation

## 2024-06-29 ENCOUNTER — Other Ambulatory Visit: Payer: Self-pay

## 2024-06-29 VITALS — BP 138/85 | HR 69

## 2024-06-29 DIAGNOSIS — M5416 Radiculopathy, lumbar region: Secondary | ICD-10-CM | POA: Diagnosis not present

## 2024-06-29 MED ORDER — METHYLPREDNISOLONE ACETATE 80 MG/ML IJ SUSP
40.0000 mg | Freq: Once | INTRAMUSCULAR | Status: AC
  Administered 2024-06-29: 40 mg

## 2024-06-29 NOTE — Progress Notes (Signed)
 Pain Scale   Average Pain 10 Patient advising he has lower back pain radiating mostly to right hip and leg area.Patient advising his pain increases when walking and standing and decreases when sitting.        +Driver, -BT, -Dye Allergies.

## 2024-07-05 ENCOUNTER — Ambulatory Visit: Payer: Self-pay | Admitting: Physical Medicine and Rehabilitation

## 2024-07-06 NOTE — Procedures (Signed)
 Lumbosacral Transforaminal Epidural Steroid Injection - Sub-Pedicular Approach with Fluoroscopic Guidance  Patient: Troy Christian      Date of Birth: 07-Oct-1956 MRN: [id number] PCP: Katrinka Garnette KIDD, MD      Visit Date: 06/29/2024   Universal Protocol:    Date/Time: 06/29/2024  Consent Given By: the patient  Position: PRONE  Additional Comments: Vital signs were monitored before and after the procedure. Patient was prepped and draped in the usual sterile fashion. The correct patient, procedure, and site was verified.   Injection Procedure Details:   Procedure diagnoses: Lumbar radiculopathy [M54.16]    Meds Administered:  Meds ordered this encounter  Medications   methylPREDNISolone  acetate (DEPO-MEDROL ) injection 40 mg    Laterality: Bilateral  Location/Site: L4  Needle:5.0 in., 22 ga.  Short bevel or Quincke spinal needle  Needle Placement: Transforaminal  Findings:    -Comments: Excellent flow of contrast along the nerve, nerve root and into the epidural space.  Procedure Details: After squaring off the end-plates to get a true AP view, the C-arm was positioned so that an oblique view of the foramen as noted above was visualized. The target area is just inferior to the nose of the scotty dog or sub pedicular. The soft tissues overlying this structure were infiltrated with 2-3 ml. of 1% Lidocaine without Epinephrine.  The spinal needle was inserted toward the target using a trajectory view along the fluoroscope beam.  Under AP and lateral visualization, the needle was advanced so it did not puncture dura and was located close the 6 O'Clock position of the pedical in AP tracterory. Biplanar projections were used to confirm position. Aspiration was confirmed to be negative for CSF and/or blood. A 1-2 ml. volume of Isovue -250 was injected and flow of contrast was noted at each level. Radiographs were obtained for documentation purposes.   After attaining the desired  flow of contrast documented above, a 0.5 to 1.0 ml test dose of 0.25% Marcaine was injected into each respective transforaminal space.  The patient was observed for 90 seconds post injection.  After no sensory deficits were reported, and normal lower extremity motor function was noted,   the above injectate was administered so that equal amounts of the injectate were placed at each foramen (level) into the transforaminal epidural space.   Additional Comments:  No complications occurred Dressing: 2 x 2 sterile gauze and Band-Aid    Post-procedure details: Patient was observed during the procedure. Post-procedure instructions were reviewed.  Patient left the clinic in stable condition.

## 2024-07-06 NOTE — Progress Notes (Signed)
 Troy Christian - 68 y.o. male MRN [id number]  Date of birth: 1955/12/02  Office Visit Note: Visit Date: 06/29/2024 PCP: Katrinka Garnette KIDD, MD Referred by: Georgina Ozell DELENA, MD  Subjective: Chief Complaint  Patient presents with   Lower Back - Pain   HPI:  Troy Christian is a 68 y.o. male who comes in today at the request of Dr. Ozell Georgina for planned Bilateral L4-5 Lumbar Transforaminal epidural steroid injection with fluoroscopic guidance.  The patient has failed conservative care including home exercise, medications, time and activity modification.  This injection will be diagnostic and hopefully therapeutic.  Please see requesting physician notes for further details and justification. Patient very anxious about injections.   ROS Otherwise per HPI.  Assessment & Plan: Visit Diagnoses:    ICD-10-CM   1. Lumbar radiculopathy  M54.16 XR C-ARM NO REPORT    Epidural Steroid injection    methylPREDNISolone  acetate (DEPO-MEDROL ) injection 40 mg      Plan: No additional findings.   Meds & Orders:  Meds ordered this encounter  Medications   methylPREDNISolone  acetate (DEPO-MEDROL ) injection 40 mg    Orders Placed This Encounter  Procedures   XR C-ARM NO REPORT   Epidural Steroid injection    Follow-up: Return for visit to requesting provider as needed.   Procedures: No procedures performed  Lumbosacral Transforaminal Epidural Steroid Injection - Sub-Pedicular Approach with Fluoroscopic Guidance  Patient: Troy Christian      Date of Birth: 07/22/1956 MRN: [id number] PCP: Katrinka Garnette KIDD, MD      Visit Date: 06/29/2024   Universal Protocol:    Date/Time: 06/29/2024  Consent Given By: the patient  Position: PRONE  Additional Comments: Vital signs were monitored before and after the procedure. Patient was prepped and draped in the usual sterile fashion. The correct patient, procedure, and site was verified.   Injection Procedure Details:   Procedure diagnoses: Lumbar  radiculopathy [M54.16]    Meds Administered:  Meds ordered this encounter  Medications   methylPREDNISolone  acetate (DEPO-MEDROL ) injection 40 mg    Laterality: Bilateral  Location/Site: L4  Needle:5.0 in., 22 ga.  Short bevel or Quincke spinal needle  Needle Placement: Transforaminal  Findings:    -Comments: Excellent flow of contrast along the nerve, nerve root and into the epidural space.  Procedure Details: After squaring off the end-plates to get a true AP view, the C-arm was positioned so that an oblique view of the foramen as noted above was visualized. The target area is just inferior to the nose of the scotty dog or sub pedicular. The soft tissues overlying this structure were infiltrated with 2-3 ml. of 1% Lidocaine without Epinephrine.  The spinal needle was inserted toward the target using a trajectory view along the fluoroscope beam.  Under AP and lateral visualization, the needle was advanced so it did not puncture dura and was located close the 6 O'Clock position of the pedical in AP tracterory. Biplanar projections were used to confirm position. Aspiration was confirmed to be negative for CSF and/or blood. A 1-2 ml. volume of Isovue -250 was injected and flow of contrast was noted at each level. Radiographs were obtained for documentation purposes.   After attaining the desired flow of contrast documented above, a 0.5 to 1.0 ml test dose of 0.25% Marcaine was injected into each respective transforaminal space.  The patient was observed for 90 seconds post injection.  After no sensory deficits were reported, and normal lower extremity motor function was noted,  the above injectate was administered so that equal amounts of the injectate were placed at each foramen (level) into the transforaminal epidural space.   Additional Comments:  No complications occurred Dressing: 2 x 2 sterile gauze and Band-Aid    Post-procedure details: Patient was observed during the  procedure. Post-procedure instructions were reviewed.  Patient left the clinic in stable condition.    Clinical History: CLINICAL DATA:  Low back pain with left-sided radiculopathy   EXAM: MRI LUMBAR SPINE WITHOUT CONTRAST   TECHNIQUE: Multiplanar, multisequence MR imaging of the lumbar spine was performed. No intravenous contrast was administered.   COMPARISON:  02/06/2018   FINDINGS: Segmentation:  Standard.   Alignment:  Physiologic.   Vertebrae: No fracture, evidence of discitis, or bone lesion. 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   Conus medullaris and cauda equina: Conus extends to the T12-L1 level. Conus and cauda equina appear normal.   Paraspinal and other soft tissues: Negative.   Disc levels:   T12-L1: No significant disc protrusion, foraminal stenosis, or canal stenosis.   L1-L2: No significant disc protrusion, foraminal stenosis, or canal stenosis.   L2-L3: Mild diffuse disc bulge with mild bilateral facet arthrosis and ligamentum flavum buckling. No foraminal stenosis. Borderline canal stenosis. No change from prior.   L3-L4: Diffuse disc bulge, eccentric to the right with a posterior annular fissure which is new from prior. Mild to moderate bilateral facet arthrosis and buckling of the ligamentum flavum result in mild-to-moderate left and mild right foraminal stenosis. Right greater than left subarticular recess stenosis and moderate canal stenosis. No significant interval progression in the degree of stenosis.   L4-L5: Diffuse disc bulge, left worse than right facet arthropathy, and ligamentum flavum buckling result in moderate left and mild right foraminal stenosis with moderate to severe canal stenosis. There is narrowing of the subarticular recesses bilaterally. No significant interval progression.   L5-S1: Diffuse disc bulge with moderate bilateral facet arthrosis without significant  foraminal or canal stenosis. No change from prior.   IMPRESSION: 1. Multilevel lumbar spondylosis, most pronounced at the L3-4 level where there is moderate canal stenosis. Overall, no interval progression compared to prior MRI 02/06/2018. 2. Posterior annular fissure at L3-4, which appears new from prior.     Electronically Signed   By: Mabel Converse M.D.   On: 06/04/2019 08:35     Objective:  VS:  HT:    WT:   BMI:     BP:138/85  HR:69bpm  TEMP: ( )  RESP:  Physical Exam Vitals and nursing note reviewed.  Constitutional:      General: He is not in acute distress.    Appearance: Normal appearance. He is not ill-appearing.  HENT:     Head: Normocephalic and atraumatic.     Right Ear: External ear normal.     Left Ear: External ear normal.     Nose: No congestion.  Eyes:     Extraocular Movements: Extraocular movements intact.  Cardiovascular:     Rate and Rhythm: Normal rate.     Pulses: Normal pulses.  Pulmonary:     Effort: Pulmonary effort is normal. No respiratory distress.  Abdominal:     General: There is no distension.     Palpations: Abdomen is soft.  Musculoskeletal:        General: No tenderness or signs of injury.     Cervical back: Neck supple.     Right lower leg: No edema.  Left lower leg: No edema.     Comments: Patient has good distal strength without clonus.  Skin:    Findings: No erythema or rash.  Neurological:     General: No focal deficit present.     Mental Status: He is alert and oriented to person, place, and time.     Sensory: No sensory deficit.     Motor: No weakness or abnormal muscle tone.     Coordination: Coordination normal.  Psychiatric:        Mood and Affect: Mood normal.        Behavior: Behavior normal.      Imaging: No results found.

## 2024-07-15 NOTE — Telephone Encounter (Signed)
 Patient has been scheduled for surgery on 08/17/24.

## 2024-08-11 ENCOUNTER — Encounter (HOSPITAL_COMMUNITY): Payer: Self-pay

## 2024-08-11 NOTE — Progress Notes (Signed)
 PCP - Dr Garnette Lukes Cardiologist - none  Chest x-ray - n/a EKG - n/a Stress Test - n/a ECHO - 09/01/20 Cardiac Cath - n/a  ICD Pacemaker/Loop - n/a  Sleep Study -  n/a  Diabetes - n/a  Aspirin & Blood Thinner Instructions:  n/a  ERAS - clear liquids til 10:20 AM DOS PRE-SURGERY Ensure given with instructions,  Anesthesia review: no  STOP now taking any Aspirin (unless otherwise instructed by your surgeon), Aleve , Naproxen , Ibuprofen , Motrin , Advil , Goody's, BC's, all herbal medications, fish oil, and all vitamins.   Coronavirus Screening Do you have any of the following symptoms:  Cough yes/no: No Fever (>100.72F)  yes/no: No Runny nose yes/no: No Sore throat yes/no: No Difficulty breathing/shortness of breath  yes/no: No  Have you traveled in the last 14 days and where? yes/no: No  Patient verbalized understanding of instructions that were given to them at the PAT appointment. Patient was also instructed that they will need to review over the PAT instructions again at home before surgery.

## 2024-08-11 NOTE — Progress Notes (Signed)
 Surgical Instructions   Your procedure is scheduled on Tuesday, 08/17/24.   Report to [facility] Main Entrance A at  11:20 A.M., then check in with the Admitting office. Any questions or running late day of surgery: call [phone]  Questions prior to your surgery date: call [phone], Monday-Friday, 8am-4pm. If you experience any cold or flu symptoms such as cough, fever, chills, shortness of breath, etc. between now and your scheduled surgery, please notify us  at the above number.     Remember:  Do not eat after midnight the night before your surgery-Monday  You may drink clear liquids until 10:20 AM, the morning of your surgery-Tuesday.   Clear liquids allowed are: Water, Non-Citrus Juices (without pulp), Carbonated Beverages, Clear Tea (no milk, honey, etc.), Black Coffee Only (NO MILK, CREAM OR POWDERED CREAMER of any kind), and Gatorade.    Take these medicines the morning of surgery with A SIP OF WATER: ezetimibe  (ZETIA )     May take these medicines IF NEEDED: acetaminophen  (TYLENOL )   LORazepam  (ATIVAN )    Please complete your PRE-SURGERY ENSURE that was provided to you by 10:20 AM. the morning of surgery.  Please, if able, drink it in one setting. DO NOT SIP.  One week prior to surgery, STOP taking any Aspirin (unless otherwise instructed by your surgeon) Aleve , Naproxen , Ibuprofen , Voltaren, Motrin , Advil , Goody's, BC's, all herbal medications, fish oil, and non-prescription vitamins.                     Do NOT Smoke (Tobacco/Vaping) for 24 hours prior to your procedure.  If you use a CPAP at night, you may bring your mask/headgear for your overnight stay.   You will be asked to remove any contacts, glasses, piercing's, hearing aid's, dentures/partials prior to surgery. Please bring cases for these items if needed.    Patients discharged the day of surgery will not be allowed to drive home, and someone needs to stay with them for 24 hours.  SURGICAL WAITING  ROOM VISITATION Patients may have no more than 2 support people in the waiting area - these visitors may rotate.   Pre-op nurse will coordinate an appropriate time for 1 ADULT support person, who may not rotate, to accompany patient in pre-op.  Children under the age of 41 must have an adult with them who is not the patient and must remain in the main waiting area with an adult.  If the patient needs to stay at the hospital during part of their recovery, the visitor guidelines for inpatient rooms apply.  Please refer to the [facility] website for the visitor guidelines for any additional information.   If you received a COVID test during your pre-op visit  it is requested that you wear a mask when out in public, stay away from anyone that may not be feeling well and notify your surgeon if you develop symptoms. If you have been in contact with anyone that has tested positive in the last 10 days please notify you surgeon.      Pre-operative 4 CHG Bathing Instructions   You can play a key role in reducing the risk of infection after surgery. Your skin needs to be as free of germs as possible. You can reduce the number of germs on your skin by washing with CHG (chlorhexidine gluconate) soap before surgery. CHG is an antiseptic soap that kills germs and continues to kill germs even after washing.   DO NOT use if you have  an allergy to chlorhexidine/CHG or antibacterial soaps. If your skin becomes reddened or irritated, stop using the CHG and notify one of our RNs at [phone].   Please shower with the CHG soap starting 4 days before surgery using the following schedule:     Please keep in mind the following:  DO NOT shave, including legs and underarms, starting the day of your first shower.   You may shave your face at any point before/day of surgery.  Place clean sheets on your bed the day you start using CHG soap. Use a clean washcloth (not used since being washed) for each shower. DO  NOT sleep with pets once you start using the CHG.   CHG Shower Instructions:  Wash your face and private area with normal soap. If you choose to wash your hair, wash first with your normal shampoo.  After you use shampoo/soap, rinse your hair and body thoroughly to remove shampoo/soap residue.  Turn the water OFF and apply  bottle of CHG soap to a CLEAN washcloth.  Apply CHG soap ONLY FROM YOUR NECK DOWN TO YOUR TOES (washing for 3-5 minutes)  DO NOT use CHG soap on face, private areas, open wounds, or sores.  Pay special attention to the area where your surgery is being performed.  If you are having back surgery, having someone wash your back for you may be helpful. Wait 2 minutes after CHG soap is applied, then you may rinse off the CHG soap.  Pat dry with a clean towel  Put on clean clothes/pajamas   If you choose to wear lotion, please use ONLY the CHG-compatible lotions that are listed below.  Additional instructions for the day of surgery:  If you choose, you may shower the morning of surgery with an antibacterial soap.  DO NOT APPLY any lotions, deodorants, cologne, or perfumes.   Do not bring valuables to the hospital. [facility] is not responsible for any belongings/valuables. Do not wear nail polish, gel polish, artificial nails, or any other type of covering on natural nails (fingers and toes) Do not wear jewelry or makeup Put on clean/comfortable clothes.  Please brush your teeth.  Ask your nurse before applying any prescription medications to the skin.     CHG Compatible Lotions   Aveeno Moisturizing lotion  Cetaphil Moisturizing Cream  Cetaphil Moisturizing Lotion  Clairol Herbal Essence Moisturizing Lotion, Dry Skin  Clairol Herbal Essence Moisturizing Lotion, Extra Dry Skin  Clairol Herbal Essence Moisturizing Lotion, Normal Skin  Curel Age Defying Therapeutic Moisturizing Lotion with Alpha Hydroxy  Curel Extreme Care Body Lotion  Curel Soothing Hands  Moisturizing Hand Lotion  Curel Therapeutic Moisturizing Cream, Fragrance-Free  Curel Therapeutic Moisturizing Lotion, Fragrance-Free  Curel Therapeutic Moisturizing Lotion, Original Formula  Eucerin Daily Replenishing Lotion  Eucerin Dry Skin Therapy Plus Alpha Hydroxy Crme  Eucerin Dry Skin Therapy Plus Alpha Hydroxy Lotion  Eucerin Original Crme  Eucerin Original Lotion  Eucerin Plus Crme Eucerin Plus Lotion  Eucerin TriLipid Replenishing Lotion  Keri Anti-Bacterial Hand Lotion  Keri Deep Conditioning Original Lotion Dry Skin Formula Softly Scented  Keri Deep Conditioning Original Lotion, Fragrance Free Sensitive Skin Formula  Keri Lotion Fast Absorbing Fragrance Free Sensitive Skin Formula  Keri Lotion Fast Absorbing Softly Scented Dry Skin Formula  Keri Original Lotion  Keri Skin Renewal Lotion Keri Silky Smooth Lotion  Keri Silky Smooth Sensitive Skin Lotion  Nivea Body Creamy Conditioning Oil  Nivea Body Extra Enriched Building Services Engineer  Sheer Moisturizing Lotion Nivea Crme  Nivea Skin Firming Lotion  NutraDerm 30 Skin Lotion  NutraDerm Skin Lotion  NutraDerm Therapeutic Skin Cream  NutraDerm Therapeutic Skin Lotion  ProShield Protective Hand Cream  Provon moisturizing lotion  Please read over the following fact sheets that you were given.

## 2024-08-12 ENCOUNTER — Encounter (HOSPITAL_COMMUNITY): Payer: Self-pay

## 2024-08-12 ENCOUNTER — Other Ambulatory Visit: Payer: Self-pay

## 2024-08-12 ENCOUNTER — Encounter (HOSPITAL_COMMUNITY)
Admission: RE | Admit: 2024-08-12 | Discharge: 2024-08-12 | Disposition: A | Discharge status: Home / Self Care | Source: Ambulatory Visit | Attending: Orthopedic Surgery | Admitting: Orthopedic Surgery

## 2024-08-12 VITALS — BP 141/94 | HR 65 | Temp 97.8°F | Resp 18 | Ht 69.0 in | Wt 182.0 lb

## 2024-08-12 DIAGNOSIS — Z01818 Encounter for other preprocedural examination: Secondary | ICD-10-CM | POA: Diagnosis present

## 2024-08-12 LAB — SURGICAL PCR SCREEN
MRSA, PCR: NEGATIVE
Staphylococcus aureus: NEGATIVE

## 2024-08-12 LAB — BASIC METABOLIC PANEL WITH GFR
Anion gap: 7 (ref 5–15)
BUN: 13 mg/dL (ref 8–23)
CO2: 27 mmol/L (ref 22–32)
Calcium: 9 mg/dL (ref 8.9–10.3)
Chloride: 104 mmol/L (ref 98–111)
Creatinine, Ser: 1.12 mg/dL (ref 0.61–1.24)
GFR, Estimated: >60 mL/min (ref >60)
Glucose, Bld: 97 mg/dL (ref 70–99)
Potassium: 4.3 mmol/L (ref 3.5–5.1)
Sodium: 138 mmol/L (ref 135–145)

## 2024-08-12 LAB — CBC
HCT: 46.6 % (ref 39.0–52.0)
Hemoglobin: 16 g/dL (ref 13.0–17.0)
MCH: 31.4 pg (ref 26.0–34.0)
MCHC: 34.3 g/dL (ref 30.0–36.0)
MCV: 91.6 fL (ref 80.0–100.0)
Platelets: 275 K/uL (ref 150–400)
RBC: 5.09 MIL/uL (ref 4.22–5.81)
RDW: 13 % (ref 11.5–15.5)
WBC: 4.6 K/uL (ref 4.0–10.5)
nRBC: 0 % (ref 0.0–0.2)

## 2024-08-16 ENCOUNTER — Encounter: Payer: Self-pay | Admitting: Radiology

## 2024-08-17 ENCOUNTER — Ambulatory Visit (HOSPITAL_COMMUNITY): Payer: Self-pay | Admitting: Anesthesiology

## 2024-08-17 ENCOUNTER — Encounter (HOSPITAL_COMMUNITY)
Admission: RE | Disposition: A | Discharge status: Home / Self Care | Payer: Self-pay | Source: Ambulatory Visit | Attending: Orthopedic Surgery

## 2024-08-17 ENCOUNTER — Ambulatory Visit (HOSPITAL_BASED_OUTPATIENT_CLINIC_OR_DEPARTMENT_OTHER): Payer: Self-pay | Admitting: Anesthesiology

## 2024-08-17 ENCOUNTER — Ambulatory Visit (HOSPITAL_COMMUNITY)
Admission: RE | Admit: 2024-08-17 | Discharge: 2024-08-17 | Disposition: A | Discharge status: Home / Self Care | Source: Ambulatory Visit | Attending: Orthopedic Surgery | Admitting: Orthopedic Surgery

## 2024-08-17 ENCOUNTER — Ambulatory Visit (HOSPITAL_COMMUNITY)

## 2024-08-17 ENCOUNTER — Encounter (HOSPITAL_COMMUNITY): Payer: Self-pay | Admitting: Orthopedic Surgery

## 2024-08-17 ENCOUNTER — Other Ambulatory Visit (HOSPITAL_COMMUNITY): Payer: Self-pay

## 2024-08-17 ENCOUNTER — Other Ambulatory Visit: Payer: Self-pay

## 2024-08-17 DIAGNOSIS — K219 Gastro-esophageal reflux disease without esophagitis: Secondary | ICD-10-CM | POA: Insufficient documentation

## 2024-08-17 DIAGNOSIS — Z01818 Encounter for other preprocedural examination: Secondary | ICD-10-CM

## 2024-08-17 DIAGNOSIS — M5416 Radiculopathy, lumbar region: Secondary | ICD-10-CM | POA: Diagnosis present

## 2024-08-17 DIAGNOSIS — E785 Hyperlipidemia, unspecified: Secondary | ICD-10-CM | POA: Diagnosis not present

## 2024-08-17 DIAGNOSIS — M48061 Spinal stenosis, lumbar region without neurogenic claudication: Secondary | ICD-10-CM | POA: Insufficient documentation

## 2024-08-17 HISTORY — PX: DECOMPRESSIVE LUMBAR LAMINECTOMY LEVEL 1: SHX5791

## 2024-08-17 SURGERY — DECOMPRESSIVE LUMBAR LAMINECTOMY LEVEL 1
Anesthesia: General

## 2024-08-17 MED ORDER — POVIDONE-IODINE 10 % EX SWAB: 2.0000 | Freq: Once | CUTANEOUS | Status: DC

## 2024-08-17 MED ORDER — ROCURONIUM BROMIDE 10 MG/ML (PF) SYRINGE
PREFILLED_SYRINGE | INTRAVENOUS | Status: DC | PRN
  Administered 2024-08-17: 20 mg via INTRAVENOUS
  Administered 2024-08-17: 60 mg via INTRAVENOUS
  Administered 2024-08-17 (×2): 20 mg via INTRAVENOUS

## 2024-08-17 MED ORDER — VANCOMYCIN HCL 1000 MG IV SOLR
INTRAVENOUS | Status: AC
  Filled 2024-08-17: qty 20

## 2024-08-17 MED ORDER — CHLORHEXIDINE GLUCONATE 0.12 % MT SOLN
OROMUCOSAL | Status: AC
  Administered 2024-08-17: 15 mL via OROMUCOSAL
  Filled 2024-08-17: qty 15

## 2024-08-17 MED ORDER — ROCURONIUM BROMIDE 10 MG/ML (PF) SYRINGE
PREFILLED_SYRINGE | INTRAVENOUS | Status: AC
  Filled 2024-08-17: qty 10

## 2024-08-17 MED ORDER — ORAL CARE MOUTH RINSE: 15.0000 mL | Freq: Once | OROMUCOSAL | Status: AC

## 2024-08-17 MED ORDER — SENNA 8.6 MG PO TABS
1.0000 | ORAL_TABLET | Freq: Two times a day (BID) | ORAL | 0 refills | Status: AC
  Filled 2024-08-17: qty 28, 14d supply, fill #0

## 2024-08-17 MED ORDER — POLYETHYLENE GLYCOL 3350 17 GM/SCOOP PO POWD
17.0000 g | Freq: Every day | ORAL | 0 refills | Status: AC
  Filled 2024-08-17: qty 238, 14d supply, fill #0

## 2024-08-17 MED ORDER — METHOCARBAMOL 500 MG PO TABS
500.0000 mg | ORAL_TABLET | Freq: Four times a day (QID) | ORAL | 0 refills | Status: AC
  Filled 2024-08-17: qty 40, 10d supply, fill #0

## 2024-08-17 MED ORDER — ONDANSETRON HCL 4 MG/2ML IJ SOLN
INTRAMUSCULAR | Status: AC
  Filled 2024-08-17: qty 2

## 2024-08-17 MED ORDER — HYDROMORPHONE HCL 1 MG/ML IJ SOLN
INTRAMUSCULAR | Status: AC
  Filled 2024-08-17: qty 1

## 2024-08-17 MED ORDER — FENTANYL CITRATE (PF) 250 MCG/5ML IJ SOLN
INTRAMUSCULAR | Status: DC | PRN
  Administered 2024-08-17: 50 ug via INTRAVENOUS
  Administered 2024-08-17: 100 ug via INTRAVENOUS

## 2024-08-17 MED ORDER — SUGAMMADEX SODIUM 200 MG/2ML IV SOLN
INTRAVENOUS | Status: DC | PRN
  Administered 2024-08-17: 163.2 mg via INTRAVENOUS

## 2024-08-17 MED ORDER — CEFAZOLIN SODIUM-DEXTROSE 2-4 GM/100ML-% IV SOLN
2.0000 g | INTRAVENOUS | Status: AC
  Administered 2024-08-17: 2 g via INTRAVENOUS
  Filled 2024-08-17: qty 100

## 2024-08-17 MED ORDER — BUPIVACAINE-EPINEPHRINE (PF) 0.25% -1:200000 IJ SOLN
INTRAMUSCULAR | Status: AC
  Filled 2024-08-17: qty 30

## 2024-08-17 MED ORDER — ACETAMINOPHEN 500 MG PO TABS
1000.0000 mg | ORAL_TABLET | Freq: Once | ORAL | Status: AC
  Administered 2024-08-17: 1000 mg via ORAL
  Filled 2024-08-17: qty 2

## 2024-08-17 MED ORDER — TRANEXAMIC ACID-NACL 1000-0.7 MG/100ML-% IV SOLN
1000.0000 mg | INTRAVENOUS | Status: AC
  Administered 2024-08-17: 1000 mg via INTRAVENOUS
  Filled 2024-08-17: qty 100

## 2024-08-17 MED ORDER — FENTANYL CITRATE (PF) 100 MCG/2ML IJ SOLN
INTRAMUSCULAR | Status: AC
  Filled 2024-08-17: qty 2

## 2024-08-17 MED ORDER — 0.9 % SODIUM CHLORIDE (POUR BTL) OPTIME
TOPICAL | Status: DC | PRN
  Administered 2024-08-17: 1000 mL

## 2024-08-17 MED ORDER — BUPIVACAINE-EPINEPHRINE 0.25% -1:200000 IJ SOLN
INTRAMUSCULAR | Status: DC | PRN
  Administered 2024-08-17: 30 mL

## 2024-08-17 MED ORDER — LIDOCAINE 2% (20 MG/ML) 5 ML SYRINGE
INTRAMUSCULAR | Status: AC
  Filled 2024-08-17: qty 5

## 2024-08-17 MED ORDER — MIDAZOLAM HCL (PF) 2 MG/2ML IJ SOLN
INTRAMUSCULAR | Status: DC | PRN
Start: 2024-08-17 — End: 2024-08-17
  Administered 2024-08-17: 2 mg via INTRAVENOUS

## 2024-08-17 MED ORDER — MIDAZOLAM HCL 2 MG/2ML IJ SOLN
INTRAMUSCULAR | Status: AC
  Filled 2024-08-17: qty 2

## 2024-08-17 MED ORDER — LACTATED RINGERS IV SOLN: INTRAVENOUS | Status: DC

## 2024-08-17 MED ORDER — ALBUMIN HUMAN 5 % IV SOLN: INTRAVENOUS | Status: DC | PRN

## 2024-08-17 MED ORDER — OXYCODONE HCL 5 MG PO TABS: 5.0000 mg | ORAL_TABLET | Freq: Once | ORAL | Status: DC | PRN

## 2024-08-17 MED ORDER — PROPOFOL 10 MG/ML IV BOLUS
INTRAVENOUS | Status: DC | PRN
  Administered 2024-08-17: 150 mg via INTRAVENOUS

## 2024-08-17 MED ORDER — PHENYLEPHRINE HCL-NACL 20-0.9 MG/250ML-% IV SOLN
INTRAVENOUS | Status: DC | PRN
Start: 2024-08-17 — End: 2024-08-17
  Administered 2024-08-17: 40 ug/min via INTRAVENOUS

## 2024-08-17 MED ORDER — PHENYLEPHRINE 80 MCG/ML (10ML) SYRINGE FOR IV PUSH (FOR BLOOD PRESSURE SUPPORT)
PREFILLED_SYRINGE | INTRAVENOUS | Status: DC | PRN
  Administered 2024-08-17: 160 ug via INTRAVENOUS
  Administered 2024-08-17 (×2): 80 ug via INTRAVENOUS

## 2024-08-17 MED ORDER — OXYCODONE HCL 5 MG/5ML PO SOLN: 5.0000 mg | Freq: Once | ORAL | Status: DC | PRN

## 2024-08-17 MED ORDER — PROPOFOL 10 MG/ML IV BOLUS
INTRAVENOUS | Status: AC
  Filled 2024-08-17: qty 20

## 2024-08-17 MED ORDER — FENTANYL CITRATE (PF) 250 MCG/5ML IJ SOLN
INTRAMUSCULAR | Status: AC
  Filled 2024-08-17: qty 5

## 2024-08-17 MED ORDER — VANCOMYCIN HCL 1000 MG IV SOLR
INTRAVENOUS | Status: DC | PRN
  Administered 2024-08-17: 1000 mg via TOPICAL

## 2024-08-17 MED ORDER — HYDROMORPHONE HCL 1 MG/ML IJ SOLN
0.2500 mg | INTRAMUSCULAR | Status: DC | PRN
  Administered 2024-08-17 (×4): 0.5 mg via INTRAVENOUS

## 2024-08-17 MED ORDER — HYDROCODONE-ACETAMINOPHEN 5-325 MG PO TABS
1.0000 | ORAL_TABLET | ORAL | 0 refills | Status: AC | PRN
  Filled 2024-08-17: qty 40, 7d supply, fill #0

## 2024-08-17 MED ORDER — CHLORHEXIDINE GLUCONATE 0.12 % MT SOLN: 15.0000 mL | Freq: Once | OROMUCOSAL | Status: AC

## 2024-08-17 MED ORDER — AMISULPRIDE (ANTIEMETIC) 5 MG/2ML IV SOLN: 10.0000 mg | Freq: Once | INTRAVENOUS | Status: DC | PRN

## 2024-08-17 MED ORDER — DEXAMETHASONE SOD PHOSPHATE PF 10 MG/ML IJ SOLN
10.0000 mg | Freq: Once | INTRAMUSCULAR | Status: AC
  Administered 2024-08-17: 10 mg via INTRAVENOUS

## 2024-08-17 MED ORDER — ONDANSETRON HCL 4 MG/2ML IJ SOLN
INTRAMUSCULAR | Status: DC | PRN
  Administered 2024-08-17: 4 mg via INTRAVENOUS

## 2024-08-17 MED ORDER — LIDOCAINE 2% (20 MG/ML) 5 ML SYRINGE
INTRAMUSCULAR | Status: DC | PRN
Start: 2024-08-17 — End: 2024-08-17
  Administered 2024-08-17: 100 mg via INTRAVENOUS

## 2024-08-17 SURGICAL SUPPLY — 36 items
BLADE CLIPPER SURG (BLADE) IMPLANT
BUR MATCHSTICK NEURO 3.0 LAGG (BURR) ×1 IMPLANT
CANISTER SUCTION 3000ML PPV (SUCTIONS) ×1 IMPLANT
COVER MAYO STAND STRL (DRAPES) ×3 IMPLANT
COVER SURGICAL LIGHT HANDLE (MISCELLANEOUS) ×1 IMPLANT
DERMABOND ADVANCED .7 DNX12 (GAUZE/BANDAGES/DRESSINGS) ×1 IMPLANT
DRAPE C-ARM 42X72 X-RAY (DRAPES) ×1 IMPLANT
DRAPE UTILITY XL STRL (DRAPES) ×2 IMPLANT
DRESSING MEPILEX FLEX 4X4 (GAUZE/BANDAGES/DRESSINGS) ×1 IMPLANT
DRSG IV TEGADERM 3.5X4.5 STRL (GAUZE/BANDAGES/DRESSINGS) IMPLANT
DRSG MEPILEX BORD LITE 2X5 (GAUZE/BANDAGES/DRESSINGS) IMPLANT
DRSG MEPILEX POST OP 4X8 (GAUZE/BANDAGES/DRESSINGS) ×1 IMPLANT
DRSG TEGADERM 4X4.75 (GAUZE/BANDAGES/DRESSINGS) ×2 IMPLANT
DURAPREP 26ML APPLICATOR (WOUND CARE) ×1 IMPLANT
ELECT COATED BLADE 2.86 ST (ELECTRODE) ×1 IMPLANT
ELECT PENCIL ROCKER SW 15FT (MISCELLANEOUS) ×1 IMPLANT
ELECTRODE REM PT RTRN 9FT ADLT (ELECTROSURGICAL) ×1 IMPLANT
GAUZE SPONGE 4X4 12PLY STRL (GAUZE/BANDAGES/DRESSINGS) ×1 IMPLANT
GLOVE INDICATOR 7.5 STRL GRN (GLOVE) ×1 IMPLANT
GLOVE SS BIOGEL STRL SZ 7.5 (GLOVE) ×1 IMPLANT
GOWN STRL REUS W/ TWL LRG LVL3 (GOWN DISPOSABLE) ×1 IMPLANT
GOWN STRL SURGICAL XL XLNG (GOWN DISPOSABLE) ×1 IMPLANT
KIT BASIN OR (CUSTOM PROCEDURE TRAY) ×1 IMPLANT
KIT POSITIONER JACKSON TABLE (MISCELLANEOUS) ×1 IMPLANT
KIT TURNOVER KIT B (KITS) ×1 IMPLANT
PACK LAMINECTOMY ORTHO (CUSTOM PROCEDURE TRAY) ×1 IMPLANT
SOLN 0.9% NACL POUR BTL 1000ML (IV SOLUTION) ×1 IMPLANT
SOLN STERILE WATER BTL 1000 ML (IV SOLUTION) ×1 IMPLANT
SUCTION TUBE FRAZIER 10FR DISP (SUCTIONS) ×1 IMPLANT
SUT BONE WAX W31G (SUTURE) ×1 IMPLANT
SUT MNCRL AB 3-0 PS2 27 (SUTURE) ×1 IMPLANT
SUT VIC AB 0 CT1 18XCR BRD8 (SUTURE) ×1 IMPLANT
SUT VIC AB 2-0 CT1 18 (SUTURE) ×1 IMPLANT
TOWEL GREEN STERILE (TOWEL DISPOSABLE) ×1 IMPLANT
TOWEL GREEN STERILE FF (TOWEL DISPOSABLE) ×1 IMPLANT
TUBING FEATHERFLOW (TUBING) ×1 IMPLANT

## 2024-08-17 NOTE — H&P (Signed)
 Orthopedic Spine Surgery H&P Note  Assessment: Patient is a 68 y.o. male with low back pain that radiates into bilateral lower extremities   Plan: -Out of bed as tolerated, activity as tolerated, no brace -Covered the risks of surgery one more time with the patient and patient elected to proceed with planned surgery -Written consent verified -Hold anticoagulation in anticipation of surgery -Ancef and TXA on all to OR -NPO for procedure -Site marked -To OR when ready  The risks of surgery were covered with the patient today again including but not limited to: iatrogenic instability, dural tear, nerve root injury, paralysis, persistent pain, infection, bleeding, heart attack, death, stroke, fracture, blindness, dvt/pe, and need for additional procedures.  ___________________________________________________________________________  Chief Complaint: low back pain that radiates into bilateral lower extremities  History: Patient is 68 y.o. male who has been previously seen in the office for low back pain that radiates into bilateral lower extremities. Work up was consistent with lumbar radiculopathy. These symptoms failed to improve with conservative treatment so operative management was discussed at the last office visit. The patient presents today with no changes in his symptoms since the last office visit. See previous office note for further details.    Review of systems: General: denies fevers and chills, myalgias Neurologic: denies recent changes in vision, slurred speech Abdomen: denies nausea, vomiting, hematemesis Respiratory: denies cough, shortness of breath  Past medical history: GERD HLD   Allergies: penicillin   Past surgical history:  None   Social history: Denies use of nicotine product (smoking, vaping, patches, smokeless) Alcohol use: rare Denies recreational drug use  Family history: -reviewed and not pertinent to lumbar radiculopathy   Physical  Exam:  BMI of 26.9  General: no acute distress, appears stated age Neurologic: alert, answering questions appropriately, following commands Cardiovascular: regular rate, no cyanosis Respiratory: unlabored breathing on room air, symmetric chest rise Psychiatric: appropriate affect, normal cadence to speech   MSK (spine):  -Strength exam      Left  Right  EHL    5/5  5/5 TA    5/5  5/5 GSC    5/5  5/5 Knee extension  5/5  5/5 Knee flexion   5/5  5/5 Hip flexion   5/5  5/5  -Sensory exam    Sensation intact to light touch in L3-S1 nerve distributions of bilateral lower extremities   Patient name: Troy Christian Patient MRN: [id number] Date: 08/17/24

## 2024-08-17 NOTE — Discharge Instructions (Signed)
 Orthopedic Surgery Discharge Instructions  Patient name: Troy Christian Procedure Performed: L4/5 laminectomy Date of Surgery: 08/17/2024 Surgeon: Ozell Ada, MD  Pre-operative Diagnosis: lumbar radiculopathy Post-operative Diagnosis: same as above  Discharge Date: 08/17/2024 Discharged to: home Discharge Condition: stable  Activity: You should refrain from bending, lifting, or twisting with objects greater than ten pounds until six weeks after surgery. You are encouraged to walk as much as desired. You can perform household activities such as cleaning dishes, doing laundry, vacuuming, etc. as long as the ten-pound restriction is followed. You do not need to wear a brace during the post-operative period.   Incision Care: Your incision has a dressing over it. That dressing should remain in place and dry at all times for a total of one week after surgery. After one week, you can remove the dressing. Underneath the dressing, you will find skin glue. You should leave the skin glue in place. It will fall off with time. Do not pick, rub, or scrub at it. Do not put cream or lotion over the surgical area. After one week and once the dressing is off, it is okay to let soap and water run over your incision. Again, do not pick, scrub, or rub at the skin glue when bathing. Do not submerge (e.g., take a bath, swim, go in a hot tub, etc.) until six weeks after surgery. There may be some bloody drainage from the incision into the dressing after surgery. This is normal. You do not need to replace the dressing. Continue to leave it in place for the one week as instructed above. Should the dressing become saturated with blood or drainage, please call the office for further instructions.   Medications: You have been prescribed Norco. This is a narcotic pain medication and should only be taken as prescribed. You should not drink alcohol or operate heavy machinery (including driving) while taking this medication. The  Norco can cause constipation as a side effect. For that reason, you have been prescribed senna and miralax. These are both laxatives. You do not need to take this medication if you develop diarrhea. Should you remain constipated even while taking these medications, please increase the dose of miralax to twice daily. Norco contains tylenol  in it so do not take additional tylenol  while using the Norco. Robaxin is a muscle relaxer that has been prescribed to you for muscle spasm type pain. Take this medication as needed.   You can use over-the-counter NSAIDs (ibuprofen , Aleve , Celebrex, naproxen , meloxicam, etc.) for additional pain relief after this surgery starting 72 hours after surgery. These medications are safe to take with the Tylenol  you have been prescribed. You should not take these medications if you have or have had kidney problems or gastrointestinal ulcers. Take these medications as instructed on the packaging.   In order to set expectations for opioid prescriptions, you will only be prescribed opioids for a total of six weeks after surgery and, at two-weeks after surgery, your opioid prescription will start to tapered (decreased dosage and number of pills). If you have ongoing need for opioid medication six weeks after surgery, you will be referred to pain management. If you are already established with a provider that is giving you opioid medications, you should schedule an appointment with them for six weeks after surgery if you feel you are going to need another prescription. State law only allows for opioid prescriptions one week at a time. If you are running out of opioid medication near the end of  the week, please call the office during business hours before running out so I can send you another prescription.   You may resume any home blood thinners (warfarin, lovenox, apixaban, plavix, xarelto, etc) 72 hours after your surgery. Take these medications as they were previously  prescribed.  Driving: You should not drive while taking narcotic pain medications. You should start getting back to driving slowly and you may want to try driving in a parking lot before doing anything more.   Diet: You are safe to resume your regular diet after surgery.   Reasons to Call the Office After Surgery: You should feel free to call the office with any concerns or questions you have in the post-operative period, but you should definitely notify the office if you develop: -shortness of breath, chest pain, or trouble breathing -excessive bleeding, drainage, redness, or swelling around the surgical site -fevers, chills, or pain that is getting worse with each passing day -persistent nausea or vomiting -new weakness in either leg -new or worsening numbness or tingling in either leg -numbness in the groin, bowel or bladder incontinence -other concerns about your surgery  Follow Up Appointments: You should have an office appointment scheduled for approximately two weeks after surgery. If you do not remember when this appointment is or do not already have it scheduled, please call the office to schedule.   Office Information:  -Ozell Ada, MD -Phone number: [phone] -Address: [address]       [place], [place] [address]

## 2024-08-17 NOTE — Op Note (Signed)
 Orthopedic Spine Surgery Operative Report  Procedure: L4, L5 segment lumbar laminectomies with partial medial facetectomies  Modifier: none  Date of procedure: 08/17/2024  Patient name: ZAI CHMIEL MRN: [id number] DOB: December 21, 1955  Surgeon: Ozell Ada, MD Assistant: none Pre-operative diagnosis: lumbar stenosis at L4/5, lumbar radiculopathy Post-operative diagnosis: same as above Findings: L4/5 hypertrophic facets and thickened ligamentum flavum, spondylolisthesis reduced on the table  Specimens: none Anesthesia: general EBL: 50cc Complications: none Pre-incision antibiotic: ancef TXA was given prior to incision as well  Implants: none   Indication for procedure: Patient is a 68 y.o. male who presented to the office with symptoms consistent with lumbar radiculopathy. The patient had tried conservative treatments that did not provide any lasting relief. As result, operative management was discussed. L4 and L5 segment lumbar laminectomies with partial medial facetectomies was presented as a treatment option. The risks including but not limited to iatrogenic instability, dural tear, nerve root injury, paralysis, persistent pain, infection, bleeding, heart attack, death, stroke, fracture, and need for additional procedures were discussed with the patient. The benefit of the surgery would be improvement in the patient's radiating leg pain. The alternatives to surgical management were covered with the patient and included continued monitoring, physical therapy, over-the-counter pain medications, ambulatory aids, injections, and activity modification. All the patient's questions were answered to his satisfaction. After this discussion, the patient expressed understanding and elected to proceed with surgical intervention.   Procedure Description: The patient was met in the pre-operative holding area. The patient's identity and consent were verified. The operative site was marked. The patient's  remaining questions about the surgery were answered. The patient was brought back to the operating room. General anesthesia was induced and an endotracheal tube was placed by the anesthesia staff. The patient was transferred to the prone [place] table in the prone position. All bony prominences were well padded. The head of the bed was slightly elevated and the eyes were free from compression by the face pillow. The surgical area was cleansed with alcohol. Fluoroscopy was then brought in to check rotation on the AP image and to mark the levels on the lateral image. The patient's skin was then prepped and draped in a standard, sterile fashion. A time out was performed that identified the patient, the procedure, and the operative levels. All team members agreed with what was stated in the time out.   A midline incision over the spinous processes of the previously marked levels was made and sharp dissection was continued down through the skin and dermis. Electrocautery was then used to continue the midline dissection down to the level of the spinous process. Subperiosteal dissection was performed using electrocautery to expose the lamina out lateral to the facet joint capsule. Care was taken to not violate the facet joint capsules. A lateral fluoroscopic image was taken to confirm the level. Subperiosteal dissection with electrocautery was then done to expose the lamina and pars at L4 and the cranial aspect of the L5 lamina. Again, care was taken to avoid disruption of the facet capsules.    A rongeur was used to remove the spinous processes and interspinous ligaments between the L3/4 interspinous ligament to the cranial portion of the L5 spinous process. Bone wax was used to obtain hemostasis at the bleeding bony surfaces. A high-speed burr was used to thin the lamina at L4 and the cranial aspect of L5. Above the level of the ligamentum, the lamina was thinned with the burr to the approximate level of the  ligamentum. Care was taken to leave at least 8mm of pars interarticularis on each side. A series of Kerrison rongeurs were used to remove the remaining lamina and ligamentum overlying the thecal sac. A woodsen was then used to protect the thecal sac as kerrison rongeurs were used to remove the medial portion of the L4/5 facet joint bilaterally.   A woodsen was placed into the laminectomy site to palpate for any remaining areas of stenosis. The woodsen was able to be passed freely around the thecal sac with no areas of stenosis felt.  A woodsen was placed into the cranial most aspect of the laminectomy. A lateral fluoroscopic image with a woodsen was taken demonstrating the cranial aspect of the decompression. A second image was taken with the woodsen in the caudal aspect of the decompression, demonstrating the caudal extent of the decompression.   The wound was copiously irrigated with sterile saline. 30cc of marcaine with epinephrine was injected into the muscle and soft tissue in the wound. 1g of vancomycin powder was placed into the wound. The fascia was reapproximated with 0 vicryl suture. The subcutaneous fat was reapproximated with 0 vicryl suture. The deep dermal layer was reapproximated with 2-0 vicryl. The skin as closed with a 3-0 running monocryl. All counts were correct at the end of the case. Dermabond was applied over the skin. An island dressing was placed over the wound. The patient was transferred back to a bed and brought to the post-anesthesia care unit by anesthesia staff in stable condition.  Post-operative plan: The patient will recover in the post-anesthesia care unit with plan to go home. He will be able to go home when he has a stable neuro exam from pre-op, his pain is well controlled, he ambulates, and he voids. The patient will be out of bed as tolerated with no brace. The patient will be seen in the office in approximately 2 weeks.    Ozell Ada, MD Orthopedic Surgeon

## 2024-08-17 NOTE — Transfer of Care (Signed)
 Immediate Anesthesia Transfer of Care Note  Patient: Troy Christian  Procedure(s) Performed: DECOMPRESSIVE LUMBAR LAMINECTOMY LEVEL 1  Patient Location: PACU  Anesthesia Type:General  Level of Consciousness: awake and alert   Airway & Oxygen Therapy: Patient Spontanous Breathing  Post-op Assessment: Report given to RN and Post -op Vital signs reviewed and stable  Post vital signs: Reviewed and stable  Last Vitals:  Vitals Value Taken Time  BP 152/100 08/17/24 16:26  Temp 98   Pulse 81 08/17/24 16:30  Resp 14 08/17/24 16:30  SpO2 99 % 08/17/24 16:30  Vitals shown include unfiled device data.  Last Pain:  Vitals:   08/17/24 1152  TempSrc:   PainSc: 0-No pain         Complications: No notable events documented.

## 2024-08-17 NOTE — Anesthesia Procedure Notes (Signed)
 Procedure Name: Intubation Date/Time: 08/17/2024 1:30 PM  Performed by: Amabel Stmarie C, CRNAPre-anesthesia Checklist: Patient identified, Emergency Drugs available, Suction available and Patient being monitored Patient Re-evaluated:Patient Re-evaluated prior to induction Oxygen Delivery Method: Circle system utilized Preoxygenation: Pre-oxygenation with 100% oxygen Induction Type: IV induction Ventilation: Mask ventilation without difficulty Laryngoscope Size: Mac and 4 Grade View: Grade II Nasal Tubes: Nasal prep performed and Nasal Rae Tube size: 7.5 mm Number of attempts: 1 Airway Equipment and Method: Stylet and Oral airway Placement Confirmation: ETT inserted through vocal cords under direct vision, positive ETCO2 and breath sounds checked- equal and bilateral Secured at: 23 cm Tube secured with: Tape Dental Injury: Teeth and Oropharynx as per pre-operative assessment

## 2024-08-17 NOTE — Brief Op Note (Signed)
 08/17/2024  4:26 PM  PATIENT:  Maude LABOR Dornfeld  68 y.o. male  PRE-OPERATIVE DIAGNOSIS:  LUMBAR RADICULOPATHY  POST-OPERATIVE DIAGNOSIS:  LUMBAR RADICULOPATHY  PROCEDURE:  Procedure(s) with comments: DECOMPRESSIVE LUMBAR LAMINECTOMY LEVEL 1 (N/A) - L4-5 LAMINECTOMY  SURGEON:  Surgeons and Role:    * Georgina Ozell LABOR, MD - Primary  PHYSICIAN ASSISTANT: none  ASSISTANTS: none   ANESTHESIA:   general  EBL:  50cc   BLOOD ADMINISTERED:none  DRAINS: none   LOCAL MEDICATIONS USED:  MARCAINE     SPECIMEN:  No Specimen  DISPOSITION OF SPECIMEN:  N/A  COUNTS:  YES  TOURNIQUET:  NONE  DICTATION: .Note written in EPIC  PLAN OF CARE: Discharge to home after PACU  PATIENT DISPOSITION:  PACU - hemodynamically stable.   Delay start of Pharmacological VTE agent (>24hrs) due to surgical blood loss or risk of bleeding: yes

## 2024-08-17 NOTE — Progress Notes (Signed)
 Orthopedic Surgery Post-operative Progress Note  Assessment: Patient is a 68 y.o. male who is currently admitted after undergoing L4/5 laminectomy   Plan: -Operative plans complete -Drain: none -Out of bed as tolerated, no brace -No bending/lifting/twisting greater than 10 pounds -Pain control -Regular diet -No chemoprophylaxis for dvt or antiplatelets for 72 hours after surgery -Anticipate discharge to home from PACU  ___________________________________________________________________________   Subjective: No acute events since surgery. Pain well controlled.   Objective:  General: no acute distress, appropriate affect Neurologic: alert, answering questions appropriately, following commands Respiratory: unlabored breathing on room air Skin: dressing clear/dry/intact  MSK (spine):  -Strength exam      Right  Left  EHL    5/5  5/5 TA    5/5  5/5 GSC    5/5  5/5 Knee extension  5/5  5/5 Hip flexion   5/5  5/5  -Sensory exam    Sensation intact to light touch in L3-S1 nerve distributions of bilateral lower extremities   Patient name: Troy Christian Patient MRN: [id number] Date: 08/17/24

## 2024-08-17 NOTE — Anesthesia Preprocedure Evaluation (Addendum)
 Anesthesia Evaluation  Patient identified by MRN, date of birth, ID band Patient awake    Reviewed: Allergy & Precautions, NPO status , Patient's Chart, lab work & pertinent test results  History of Anesthesia Complications Negative for: history of anesthetic complications  Airway Mallampati: II  TM Distance: >3 FB Neck ROM: Full    Dental  (+) Dental Advisory Given   Pulmonary neg shortness of breath, neg sleep apnea, neg COPD, neg recent URI, former smoker   Pulmonary exam normal breath sounds clear to auscultation       Cardiovascular (-) hypertension(-) angina (-) Past MI, (-) Cardiac Stents and (-) CABG (-) dysrhythmias  Rhythm:Regular Rate:Normal  HLD  TTE 09/01/2020: IMPRESSIONS    1. Left ventricular ejection fraction, by estimation, is 60 to 65%. The  left ventricle has normal function. The left ventricle has no regional  wall motion abnormalities. There is mild left ventricular hypertrophy.  Left ventricular diastolic parameters  are consistent with Grade I diastolic dysfunction (impaired relaxation).   2. Right ventricular systolic function is normal. The right ventricular  size is normal. Tricuspid regurgitation signal is inadequate for assessing  PA pressure.   3. The mitral valve is grossly normal. Trivial mitral valve  regurgitation.   4. The aortic valve is tricuspid. Aortic valve regurgitation is not  visualized.     Neuro/Psych neg Seizures PSYCHIATRIC DISORDERS Anxiety      Neuromuscular disease (lumbar radiculopathy)    GI/Hepatic Neg liver ROS,GERD  ,,  Endo/Other  negative endocrine ROS    Renal/GU negative Renal ROS   BPH    Musculoskeletal   Abdominal   Peds  Hematology negative hematology ROS (+) Lab Results      Component                Value               Date                      WBC                      4.6                 08/12/2024                HGB                      16.0                 08/12/2024                HCT                      46.6                08/12/2024                MCV                      91.6                08/12/2024                PLT                      275  08/12/2024              Anesthesia Other Findings   Reproductive/Obstetrics                              Anesthesia Physical Anesthesia Plan  ASA: 2  Anesthesia Plan: General   Post-op Pain Management: Tylenol  PO (pre-op)*   Induction: Intravenous  PONV Risk Score and Plan: 2 and Ondansetron, Dexamethasone, Midazolam and Treatment may vary due to age or medical condition  Airway Management Planned: Oral ETT  Additional Equipment:   Intra-op Plan:   Post-operative Plan: Extubation in OR  Informed Consent: I have reviewed the patients History and Physical, chart, labs and discussed the procedure including the risks, benefits and alternatives for the proposed anesthesia with the patient or authorized representative who has indicated his/her understanding and acceptance.     Dental advisory given  Plan Discussed with: CRNA and Anesthesiologist  Anesthesia Plan Comments: (Risks of general anesthesia discussed including, but not limited to, sore throat, hoarse voice, chipped/damaged teeth, injury to vocal cords, nausea and vomiting, allergic reactions, lung infection, heart attack, stroke, and death. All questions answered. )         Anesthesia Quick Evaluation

## 2024-08-18 ENCOUNTER — Encounter (HOSPITAL_COMMUNITY): Payer: Self-pay | Admitting: Orthopedic Surgery

## 2024-08-18 NOTE — Anesthesia Postprocedure Evaluation (Signed)
 Anesthesia Post Note  Patient: Troy Christian  Procedure(s) Performed: DECOMPRESSIVE LUMBAR LAMINECTOMY LEVEL 1     Patient location during evaluation: PACU Anesthesia Type: General Level of consciousness: awake Pain management: pain level controlled Vital Signs Assessment: post-procedure vital signs reviewed and stable Respiratory status: spontaneous breathing, nonlabored ventilation and respiratory function stable Cardiovascular status: blood pressure returned to baseline and stable Postop Assessment: no apparent nausea or vomiting Anesthetic complications: no   No notable events documented.  Last Vitals:  Vitals:   08/17/24 1715 08/17/24 1730  BP: 135/88 (!) 132/99  Pulse: 73 78  Resp: 12 16  Temp:  36.6 C  SpO2: 96% 99%    Last Pain:  Vitals:   08/17/24 1715  TempSrc:   PainSc: 6                  [name]

## 2024-08-19 MED ORDER — OXYCODONE HCL 5 MG PO TABS: 5.0000 mg | ORAL_TABLET | ORAL | 0 refills | Status: AC | PRN

## 2024-08-19 NOTE — Addendum Note (Signed)
 Addended by: GEORGINA SHARPER on: 08/19/2024 07:47 AM   Modules accepted: Orders

## 2024-08-25 MED ORDER — LORAZEPAM 1 MG PO TABS: 1.0000 mg | ORAL_TABLET | Freq: Three times a day (TID) | ORAL | 0 refills | Status: AC | PRN

## 2024-08-25 NOTE — Addendum Note (Signed)
 Addended by: GEORGINA SHARPER on: 08/25/2024 08:53 AM   Modules accepted: Orders

## 2024-08-30 ENCOUNTER — Ambulatory Visit: Admitting: Orthopedic Surgery

## 2024-08-30 DIAGNOSIS — Z9889 Other specified postprocedural states: Secondary | ICD-10-CM

## 2024-08-30 MED ORDER — TRAMADOL HCL 50 MG PO TABS: 50.0000 mg | ORAL_TABLET | Freq: Four times a day (QID) | ORAL | 0 refills | Status: AC | PRN

## 2024-08-30 NOTE — Progress Notes (Signed)
 Orthopedic Surgery Post-operative Office Visit  Procedure: L4/5 laminectomy Date of Surgery: 08/17/2024 (~2 weeks post-op)  Assessment: Patient is a 68 y.o. who is doing well after L4/5 laminectomy   Plan: -Operative plans complete -Out of bed as tolerated, no brace -No bending/lifting/twisting greater than 10 pounds -Okay to [employer] run over incision but do not submerge -Pain management: Tramadol  -Return to office in 4 weeks, lumbar x-rays needed at next visit: none  ___________________________________________________________________________   Subjective: Patient has been doing well since surgery.  He initially had a lot of pain around the incision but that has gotten better.  He is not taking any medication regularly for pain at this point.  He is having no radiating leg pain.  He is pleased with how his surgery has gone so far.  Initially had some drainage around the incision but that has resolved.  No redness noted.  Objective:  General: no acute distress, appropriate affect Neurologic: alert, answering questions appropriately, following commands Respiratory: unlabored breathing on room air Skin: incision is well approximated with no erythema, induration, active/expressible drainage  MSK (spine):  -Strength exam      Left  Right  EHL    5/5  5/5 TA    5/5  5/5 GSC    5/5  5/5 Knee extension  5/5  5/5 Hip flexion   5/5  5/5  -Sensory exam    Sensation intact to light touch in L3-S1 nerve distributions of bilateral lower extremities  Imaging: None obtained at today's visit   Patient name: Troy Christian Patient MRN: [id number] Date of visit: 08/30/24

## 2024-09-13 ENCOUNTER — Encounter: Payer: Self-pay | Admitting: Orthopedic Surgery

## 2024-09-13 ENCOUNTER — Telehealth: Payer: Self-pay | Admitting: Orthopedic Surgery

## 2024-09-13 NOTE — Telephone Encounter (Signed)
 See my chart message

## 2024-09-13 NOTE — Telephone Encounter (Signed)
 Pt called stating he need a 3 wk appt from last appt. Pt state he did not stop by the desk to set his 3 wk appt. Pt is asking for a call to set a sooner appt then 1/14 which is Dr Georgina next available. Explained to pt that he was suppose to stop by desk and make a 3 wk follow up post op. Please call pt at [phone].

## 2024-10-06 ENCOUNTER — Other Ambulatory Visit (INDEPENDENT_AMBULATORY_CARE_PROVIDER_SITE_OTHER): Payer: Self-pay

## 2024-10-06 ENCOUNTER — Ambulatory Visit: Admitting: Orthopedic Surgery

## 2024-10-06 DIAGNOSIS — Z9889 Other specified postprocedural states: Secondary | ICD-10-CM

## 2024-10-06 NOTE — Progress Notes (Signed)
 Orthopedic Surgery Post-operative Office Visit   Procedure: L4/5 laminectomy Date of Surgery: 08/17/2024 (~6 weeks post-op)   Assessment: Patient is a 68 y.o. who is doing well after L4/5 laminectomy     Plan: -No spine specific restrictions, gradually return to full activity -Okay to submerge at this point -Pain management: tylenol  as needed  -Return to office when he gets back in town, x-rays at next visit: AP/lateral/flex/ex   ___________________________________________________________________________     Subjective: Patient has been doing well. He is not having any radiating leg pain. Not having any back pain. Not taking any medications for pain. He has gotten back to swimming. He is touching his toes to stretch out his back everyday. He is pleased with how he is doing so far. Will be out of country until April.    Objective:   General: no acute distress, appropriate affect Neurologic: alert, answering questions appropriately, following commands Respiratory: unlabored breathing on room air Skin: incision is well healed with no erythema, induration, active/expressible drainage   MSK (spine):   -Strength exam                                                   Left                  Right   EHL                              5/5                  5/5 TA                                 5/5                  5/5 GSC                             5/5                  5/5 Knee extension            5/5                  5/5 Hip flexion                    5/5                  5/5   -Sensory exam                           Sensation intact to light touch in L3-S1 nerve distributions of bilateral lower extremities   Imaging: XRs of the lumbar spine from 10/06/2024 were independently reviewed and interpreted, showing laminectomy defect at L4/5. Spondylolisthesis seen at L4/5. No significant degenerative changes seen. No fracture or dislocation seen.       Patient name: Troy Christian Patient MRN: [id number] Date of visit: 10/06/2024

## 2025-06-22 ENCOUNTER — Ambulatory Visit
